# Patient Record
Sex: Male | Born: 1990
Health system: Southern US, Community
[De-identification: ages and names within clinical notes are randomized; demographics above are authoritative.]

## PROBLEM LIST (undated history)

## (undated) DIAGNOSIS — F329 Major depressive disorder, single episode, unspecified: Secondary | ICD-10-CM

## (undated) DIAGNOSIS — F32A Depression, unspecified: Secondary | ICD-10-CM

## (undated) DIAGNOSIS — F419 Anxiety disorder, unspecified: Secondary | ICD-10-CM

## (undated) HISTORY — DX: Depression, unspecified: F32.A

## (undated) HISTORY — DX: Anxiety disorder, unspecified: F41.9

## (undated) HISTORY — PX: KNEE ARTHROSCOPY W/ MENISCAL REPAIR: SHX1877

---
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
## 2013-03-25 ENCOUNTER — Encounter: Payer: Self-pay | Admitting: Family Medicine

## 2013-03-25 ENCOUNTER — Ambulatory Visit (INDEPENDENT_AMBULATORY_CARE_PROVIDER_SITE_OTHER): Payer: 59 | Admitting: Family Medicine

## 2013-03-25 VITALS — BP 132/86 | Ht 76.0 in | Wt 204.0 lb

## 2013-03-25 DIAGNOSIS — I889 Nonspecific lymphadenitis, unspecified: Secondary | ICD-10-CM

## 2013-03-25 NOTE — Progress Notes (Signed)
   Subjective:    Patient ID: John Holmes, male    DOB: [birth year], 23 y.o.   MRN: [id number]  HPI  Patient arrives with a marble sized  lump on left upper side of neck for several months. Area has gotten smaller in size and is not painful but has not went away. Patient notices it more when turning head.  Had a marble sized lump crop us, was a little bigger  Question swelling in this are  Non painful  A few sore pimples in the scalp   Review of Systems No headache no sore throat  Pain no recent congestion. No rash. No scalp irritation. ROS otherwise negative    Objective:   Physical Exam  Alert lungs clear. Heart regular in rhythm. HEENT scalp normal. TMs normal. Pharynx normal. Left posterior cervical chain normal lymph node palpated.      Assessment & Plan:  Impression normal lymph node architecture discussed perhaps slightly inflamed by recent scalp irritation plan definitely no need for consultation surgery etc. expect gradual resolution. WSL

## 2013-07-23 ENCOUNTER — Encounter: Payer: Self-pay | Admitting: Family Medicine

## 2013-07-23 ENCOUNTER — Ambulatory Visit (INDEPENDENT_AMBULATORY_CARE_PROVIDER_SITE_OTHER): Payer: BC Managed Care – PPO | Admitting: Family Medicine

## 2013-07-23 VITALS — BP 132/74 | Temp 98.7°F | Ht 76.0 in | Wt 205.0 lb

## 2013-07-23 DIAGNOSIS — R21 Rash and other nonspecific skin eruption: Secondary | ICD-10-CM

## 2013-07-23 NOTE — Progress Notes (Signed)
   Subjective:    Patient ID: John Holmes, male    DOB: 1990-07-09, 23 y.o.   MRN: [id number]  Rash The current episode started more than 1 month ago. Pain location: private area.    Patient is sexually active.  Developed a small spots that he thought was HPV several years ago. Now he feels may have spread.  No discharge no fever no chills   Review of Systems  Skin: Positive for rash.       Objective:   Physical Exam  Alert no apparent distress. Vitals stable. Lungs clear heart regular in rhythm. Distal penis shaft normal hypertrophic changes at juncture of glands and penile shaft. Normal. Several small tiny papules.      Assessment & Plan:  Impression 1 rash nonspecific. Possible early molluscum contagiosum discussed. No evidence of HPV or genital warts discussed. WSL

## 2013-07-23 NOTE — Patient Instructions (Signed)
Good news on the other concern, question molluscum contagiosum but doubt

## 2013-11-04 ENCOUNTER — Encounter: Payer: Self-pay | Admitting: Family Medicine

## 2013-11-04 ENCOUNTER — Ambulatory Visit (INDEPENDENT_AMBULATORY_CARE_PROVIDER_SITE_OTHER): Payer: BC Managed Care – PPO | Admitting: Family Medicine

## 2013-11-04 VITALS — BP 132/90 | Ht 76.0 in | Wt 203.0 lb

## 2013-11-04 DIAGNOSIS — R300 Dysuria: Secondary | ICD-10-CM

## 2013-11-04 DIAGNOSIS — Z113 Encounter for screening for infections with a predominantly sexual mode of transmission: Secondary | ICD-10-CM

## 2013-11-04 LAB — POCT URINALYSIS DIPSTICK
SPEC GRAV UA: 1.02
pH, UA: 6

## 2013-11-04 NOTE — Progress Notes (Signed)
   Subjective:    Patient ID: John Holmes, male    DOB: 08/21/1990, 23 y.o.   MRN: [id number]  Exposure to STD The patient's primary symptoms include genital itching and genital lesions. Primary symptoms comment: low back pain, rash . This is a new problem. The current episode started 1 to 4 weeks ago. The problem occurs daily. The problem has been gradually worsening. Associated symptoms include coughing, dysuria, a fever, flank pain and a rash. He is sexually active. It is possible that his partner has an STD.   Found out his ex-girlfriend cheated on him. He would like to be tested for STD's.   He stated he did have some back pain but that has gone away he also related some head congestion drainage and coughing that seems to have gotten better  Review of Systems  Constitutional: Positive for fever.  Respiratory: Positive for cough.   Genitourinary: Positive for dysuria and flank pain.  Skin: Positive for rash.       Objective:   Physical Exam Lungs are clear heart regular small amount healing bulbs noted on the left arm no active sign of scabies. Genital exam normal.       Assessment & Plan:  Best to use [phone] Possible STD exposure testing ordered   safety in relations discussed. The patient has severe viral illness may need further testing he will notify us if  fevers

## 2013-11-05 ENCOUNTER — Other Ambulatory Visit: Payer: Self-pay

## 2013-11-05 LAB — GC/CHLAMYDIA PROBE AMP
CT Probe RNA: POSITIVE — AB
GC PROBE AMP APTIMA: NEGATIVE

## 2013-11-05 LAB — HIV ANTIBODY (ROUTINE TESTING W REFLEX): HIV 1&2 Ab, 4th Generation: NONREACTIVE

## 2013-11-05 MED ORDER — DOXYCYCLINE HYCLATE 100 MG PO TABS: 100.0000 mg | ORAL_TABLET | Freq: Two times a day (BID) | ORAL | Status: DC

## 2013-11-06 LAB — VDRL: VDRL, Serum: NONREACTIVE

## 2013-11-30 ENCOUNTER — Telehealth: Payer: Self-pay | Admitting: Family Medicine

## 2013-11-30 NOTE — Telephone Encounter (Signed)
Patient was seen by Dr. Lorin PicketScott on 10/21/5 and diagnosed with STD.

## 2013-11-30 NOTE — Telephone Encounter (Signed)
Patient is requesting that Dr. Brett CanalesSteve call him back.  He said that the issue that he was having before has come back again and would like Rx called in.  Im assuming dysuria.    [facility]

## 2013-12-01 MED ORDER — DOXYCYCLINE HYCLATE 100 MG PO TABS: 100.0000 mg | ORAL_TABLET | Freq: Two times a day (BID) | ORAL | Status: DC

## 2013-12-01 NOTE — Telephone Encounter (Signed)
Please discuss with the patient what is going on. If he is having reoccurrence we can do one additional round of doxycycline 100 mg twice a day for 10 days with a snack in a tall glass of water. If he has further troubles he should come back in and be tested again as well as attentionally treated with a shot and oral antibiotics

## 2013-12-01 NOTE — Telephone Encounter (Signed)
Discussed with patient. Patient verbalized understanding. Rx sent electronically to pharmacy.

## 2014-03-26 ENCOUNTER — Encounter: Payer: Self-pay | Admitting: Family Medicine

## 2014-03-26 ENCOUNTER — Ambulatory Visit (INDEPENDENT_AMBULATORY_CARE_PROVIDER_SITE_OTHER): Payer: BLUE CROSS/BLUE SHIELD | Admitting: Family Medicine

## 2014-03-26 VITALS — BP 118/70 | Temp 98.3°F | Ht 76.0 in | Wt 202.4 lb

## 2014-03-26 DIAGNOSIS — M545 Low back pain, unspecified: Secondary | ICD-10-CM

## 2014-03-26 MED ORDER — ETODOLAC 400 MG PO TABS: 400.0000 mg | ORAL_TABLET | Freq: Two times a day (BID) | ORAL | Status: DC

## 2014-03-26 NOTE — Patient Instructions (Signed)

## 2014-03-26 NOTE — Progress Notes (Signed)
   Subjective:    Patient ID: John Holmes, male    DOB: Mar 12, 1990, 24 y.o.   MRN: [id number]  Back Pain This is a new problem. The current episode started more than 1 month ago. The problem occurs intermittently. The problem is unchanged. The pain is present in the lumbar spine. The quality of the pain is described as aching. The pain radiates to the right thigh and left thigh. The pain is at a severity of 8/10. The pain is moderate. The pain is the same all the time. Stiffness is present all day. Treatments tried: muscle rub  The treatment provided no relief.   Patient states that he wants to discuss his STD results with the doctor also.  Right lumbar pain over time  Very noticeable  Sits al days long   No true spasm pasin  Doing some muscle bk work  Pain comes and goes off and on   ibu two or three dys rn   Review of Systems  Musculoskeletal: Positive for back pain.   No headache no chest pain no abdominal pain no change in bowel habits    Objective:   Physical Exam  Alert vital stable no acute distress H&T normal lungs clear. Heart regular in rhythm negative straight leg raise low lumbar tenderness right lumbar region      Assessment & Plan:  Impression lumbar strain discussed subacute plan exercises discussed. Anti-inflammatory medicine prescribed. Local measures discussed WSL

## 2014-05-11 ENCOUNTER — Encounter: Payer: Self-pay | Admitting: Family Medicine

## 2014-05-11 ENCOUNTER — Ambulatory Visit (INDEPENDENT_AMBULATORY_CARE_PROVIDER_SITE_OTHER): Payer: BLUE CROSS/BLUE SHIELD | Admitting: Family Medicine

## 2014-05-11 VITALS — BP 126/82 | Ht 76.0 in | Wt 196.8 lb

## 2014-05-11 DIAGNOSIS — B349 Viral infection, unspecified: Secondary | ICD-10-CM

## 2014-05-11 NOTE — Progress Notes (Signed)
   Subjective:    Patient ID: Memory ArgueBrandon T Cravey, male    DOB: [birth year], 24 y.o.   MRN: [id number]  Headache  This is a new problem. The current episode started in the past 7 days. The problem occurs daily. He has tried NSAIDs for the symptoms.  Patient also reports of nasal congestion,, muscle weakness, and diarrhea.   Started last fri night  Felt horrible going to bed  Nauseated and felt bad  vom times one  Congested and stomach felt unsettled  Dim energy  Muscle weakness  Felt lassitude poss low gr temp  Appetite zero  Energy level zappped   Review of Systems  Neurological: Positive for headaches.   No current vomiting no rash    Objective:   Physical Exam  Alert mild malaise vitals stable hydration good. H&T moderate his congestion pharynx normal neck supple lungs clear. Heart regular in rhythm      Assessment & Plan:  Impression flu versus parainfluenza. At this time academic. No evidence of secondary brachial infection discussed plan symptom care discussed. Warning signs discussed. WSL

## 2014-06-21 ENCOUNTER — Ambulatory Visit (INDEPENDENT_AMBULATORY_CARE_PROVIDER_SITE_OTHER): Payer: BLUE CROSS/BLUE SHIELD | Admitting: Family Medicine

## 2014-06-21 ENCOUNTER — Encounter: Payer: Self-pay | Admitting: Family Medicine

## 2014-06-21 VITALS — BP 118/72 | Ht 76.0 in | Wt 214.0 lb

## 2014-06-21 DIAGNOSIS — R300 Dysuria: Secondary | ICD-10-CM | POA: Diagnosis not present

## 2014-06-21 DIAGNOSIS — Z113 Encounter for screening for infections with a predominantly sexual mode of transmission: Secondary | ICD-10-CM | POA: Diagnosis not present

## 2014-06-21 NOTE — Progress Notes (Signed)
   Subjective:    Patient ID: John Holmes, male    DOB: [birth year], 24 y.o.   MRN: [id number]  HPI  Patient arrives for a STD check.      Pt still a boit paranoid about the possibility of persistent std  afteer urinating has slight irritation.  No obvious discharge. Patient concerned about the appearance of the skin around the edge of the glands                              Review of Systems No fever no chills no abdominal pain no rash no blistering    Objective:   Physical Exam  Alert vitals stable lungs clear heart regular rhythm glands the penis within normal limits skin within normal limits. Normal papillary hypertrophy noted. GC swab obtain      Assessment & Plan:  Impression history of chlamydia and unprotected sex plan appropriate blood work. Swab sent. Safe sex discussed WSL

## 2014-06-22 LAB — RPR: RPR Ser Ql: NONREACTIVE

## 2014-06-22 LAB — HIV ANTIBODY (ROUTINE TESTING W REFLEX): HIV Screen 4th Generation wRfx: NONREACTIVE

## 2014-06-24 LAB — GC/CHLAMYDIA PROBE AMP
CHLAMYDIA, DNA PROBE: NEGATIVE
Neisseria gonorrhoeae by PCR: NEGATIVE

## 2014-09-01 ENCOUNTER — Encounter: Payer: Self-pay | Admitting: Family Medicine

## 2014-09-01 ENCOUNTER — Ambulatory Visit (INDEPENDENT_AMBULATORY_CARE_PROVIDER_SITE_OTHER): Payer: BLUE CROSS/BLUE SHIELD | Admitting: Family Medicine

## 2014-09-01 VITALS — BP 122/76 | Temp 99.2°F | Ht 76.0 in | Wt 210.0 lb

## 2014-09-01 DIAGNOSIS — B9689 Other specified bacterial agents as the cause of diseases classified elsewhere: Secondary | ICD-10-CM

## 2014-09-01 DIAGNOSIS — J019 Acute sinusitis, unspecified: Secondary | ICD-10-CM

## 2014-09-01 MED ORDER — AMOXICILLIN-POT CLAVULANATE 875-125 MG PO TABS: 1.0000 | ORAL_TABLET | Freq: Two times a day (BID) | ORAL | Status: DC

## 2014-09-01 NOTE — Patient Instructions (Signed)

## 2014-09-01 NOTE — Progress Notes (Signed)
   Subjective:    Patient ID: John Holmes, male    DOB: 06-18-1990, 24 y.o.   MRN: [id number]  Cough This is a new problem. The current episode started in the past 7 days. Associated symptoms include a fever, headaches, nasal congestion, rhinorrhea and a sore throat. Pertinent negatives include no chest pain, ear pain or wheezing. Treatments tried: nyquil.   started over the past several days head congestion drainage coughing then over the past 48 hours severe right maxillary and right frontal sinus pain some drainage. No fever no wheezing    Review of Systems  Constitutional: Positive for fever. Negative for activity change.  HENT: Positive for congestion, rhinorrhea and sore throat. Negative for ear pain.   Eyes: Negative for discharge.  Respiratory: Positive for cough. Negative for wheezing.   Cardiovascular: Negative for chest pain.  Neurological: Positive for headaches.       Objective:   Physical Exam  Constitutional: He appears well-developed.  HENT:  Head: Normocephalic.  Mouth/Throat: Oropharynx is clear and moist. No oropharyngeal exudate.  Neck: Normal range of motion.  Cardiovascular: Normal rate, regular rhythm and normal heart sounds.   No murmur heard. Pulmonary/Chest: Effort normal and breath sounds normal. He has no wheezes.  Lymphadenopathy:    He has no cervical adenopathy.  Neurological: He exhibits normal muscle tone.  Skin: Skin is warm and dry.  Nursing note and vitals reviewed.         Assessment & Plan:  Viral syndrome with secondary sinusitis antibiotics prescribed warning signs discussed follow-up if problems may use OTC decongestion's for a few days if worse follow-up

## 2015-01-07 ENCOUNTER — Encounter: Payer: Self-pay | Admitting: Family Medicine

## 2015-01-07 ENCOUNTER — Ambulatory Visit (INDEPENDENT_AMBULATORY_CARE_PROVIDER_SITE_OTHER): Payer: BLUE CROSS/BLUE SHIELD | Admitting: Family Medicine

## 2015-01-07 VITALS — BP 132/80 | Temp 98.8°F | Ht 74.5 in | Wt 216.0 lb

## 2015-01-07 DIAGNOSIS — Z113 Encounter for screening for infections with a predominantly sexual mode of transmission: Secondary | ICD-10-CM

## 2015-01-07 DIAGNOSIS — Z13220 Encounter for screening for lipoid disorders: Secondary | ICD-10-CM | POA: Diagnosis not present

## 2015-01-07 DIAGNOSIS — Z139 Encounter for screening, unspecified: Secondary | ICD-10-CM | POA: Diagnosis not present

## 2015-01-07 DIAGNOSIS — Z0000 Encounter for general adult medical examination without abnormal findings: Secondary | ICD-10-CM

## 2015-01-07 NOTE — Progress Notes (Signed)
   Subjective:    Patient ID: John Holmes, male    DOB: 1990/05/17, 24 y.o.   MRN: [id number]  HPI The patient comes in today for a wellness visit.  Still having slight dysuria, some discomfort, off an on  A review of their health history was completed.  A review of medications was also completed.  Any needed refills; not taking any meds  Eating habits: health conscious  Falls/  MVA accidents in past few months: none  Regular exercise: yes regular exercise at gym   Four to fve days per wk, now regulaly   stilll good with diet   Specialist pt sees on regular basis: none  Preventative health issues were discussed.   Additional concerns: std/hiv testing  patient is noted small bumps on his penis. No obvious pain. Also mild dysuria. Positive unprotected sex. History of chlamydia. Once to recheck   Review of Systems  Constitutional: Negative for fever, activity change and appetite change.  HENT: Negative for congestion and rhinorrhea.   Eyes: Negative for discharge.  Respiratory: Negative for cough and wheezing.   Cardiovascular: Negative for chest pain.  Gastrointestinal: Negative for vomiting, abdominal pain and blood in stool.  Genitourinary: Negative for frequency and difficulty urinating.  Musculoskeletal: Negative for neck pain.  Skin: Negative for rash.  Allergic/Immunologic: Negative for environmental allergies and food allergies.  Neurological: Negative for weakness and headaches.  Psychiatric/Behavioral: Negative for agitation.  All other systems reviewed and are negative.      Objective:   Physical Exam  Constitutional: He appears well-developed and well-nourished.  HENT:  Head: Normocephalic and atraumatic.  Right Ear: External ear normal.  Left Ear: External ear normal.  Nose: Nose normal.  Mouth/Throat: Oropharynx is clear and moist.  Eyes: EOM are normal. Pupils are equal, round, and reactive to light.  Neck: Normal range of motion. Neck  supple. No thyromegaly present.  Cardiovascular: Normal rate, regular rhythm and normal heart sounds.   No murmur heard. Pulmonary/Chest: Effort normal and breath sounds normal. No respiratory distress. He has no wheezes.  Abdominal: Soft. Bowel sounds are normal. He exhibits no distension and no mass. There is no tenderness.  Genitourinary: Penis normal.  Penis Chlamydia screen obtained. Shaft reveals several small tiny pearl-shaped bumps with central umbilication i.e. molluscum contagiosum  Musculoskeletal: Normal range of motion. He exhibits no edema.  Lymphadenopathy:    He has no cervical adenopathy.  Neurological: He is alert. He exhibits normal muscle tone.  Skin: Skin is warm and dry. No erythema.  Psychiatric: He has a normal mood and affect. His behavior is normal. Judgment normal.  Vitals reviewed.         Assessment & Plan:  IMP wellness exam, hx std. Probable moluscum contagiosum plan rec visit tpo derm, approp bw and testing , diet exerd disc

## 2015-01-12 LAB — GC/CHLAMYDIA PROBE AMP
CHLAMYDIA, DNA PROBE: NEGATIVE
Neisseria gonorrhoeae by PCR: NEGATIVE

## 2015-01-14 ENCOUNTER — Encounter: Payer: Self-pay | Admitting: Family Medicine

## 2015-01-14 ENCOUNTER — Ambulatory Visit (INDEPENDENT_AMBULATORY_CARE_PROVIDER_SITE_OTHER): Payer: BLUE CROSS/BLUE SHIELD | Admitting: Family Medicine

## 2015-01-14 VITALS — Temp 98.1°F | Ht 74.5 in | Wt 216.0 lb

## 2015-01-14 DIAGNOSIS — J019 Acute sinusitis, unspecified: Secondary | ICD-10-CM | POA: Diagnosis not present

## 2015-01-14 DIAGNOSIS — B9689 Other specified bacterial agents as the cause of diseases classified elsewhere: Secondary | ICD-10-CM

## 2015-01-14 MED ORDER — AZITHROMYCIN 250 MG PO TABS: ORAL_TABLET | ORAL | Status: DC

## 2015-01-14 NOTE — Progress Notes (Signed)
   Subjective:    Patient ID: John Holmes, male    DOB: [birth year], 24 y.o.   MRN: [id number]  Cough This is a new problem. The current episode started in the past 7 days. Associated symptoms include a fever, headaches, myalgias, nasal congestion, rhinorrhea and a sore throat. Pertinent negatives include no chest pain, ear pain or wheezing. Treatments tried: dayquil, advil, dayquil, nasal rinse.    patient started off severe body aches fever chills cough congestion and then progressed into sinus pressure pain discomfort   Review of Systems  Constitutional: Positive for fever. Negative for activity change.  HENT: Positive for congestion, rhinorrhea and sore throat. Negative for ear pain.   Eyes: Negative for discharge.  Respiratory: Positive for cough. Negative for wheezing.   Cardiovascular: Negative for chest pain.  Musculoskeletal: Positive for myalgias.  Neurological: Positive for headaches.       Objective:   Physical Exam  Constitutional: He appears well-developed.  HENT:  Head: Normocephalic.  Mouth/Throat: Oropharynx is clear and moist. No oropharyngeal exudate.  Neck: Normal range of motion.  Cardiovascular: Normal rate, regular rhythm and normal heart sounds.   No murmur heard. Pulmonary/Chest: Effort normal and breath sounds normal. He has no wheezes.  Lymphadenopathy:    He has no cervical adenopathy.  Neurological: He exhibits normal muscle tone.  Skin: Skin is warm and dry.  Nursing note and vitals reviewed.         Assessment & Plan:   viral syndrome Probable. Secondary sinusitis Antibiotics prescribed warning signs discussed.  Rest up if worse over the next week follow-up

## 2015-01-15 LAB — LIPID PANEL
CHOLESTEROL TOTAL: 129 mg/dL (ref 100–199)
Chol/HDL Ratio: 2.4 ratio units (ref 0.0–5.0)
HDL: 53 mg/dL (ref >39)
LDL CALC: 55 mg/dL (ref 0–99)
TRIGLYCERIDES: 104 mg/dL (ref 0–149)
VLDL Cholesterol Cal: 21 mg/dL (ref 5–40)

## 2015-01-15 LAB — GLUCOSE, RANDOM: Glucose: 90 mg/dL (ref 65–99)

## 2015-01-15 LAB — HIV ANTIBODY (ROUTINE TESTING W REFLEX): HIV SCREEN 4TH GENERATION: NONREACTIVE

## 2015-01-18 ENCOUNTER — Encounter: Payer: Self-pay | Admitting: Family Medicine

## 2015-06-14 ENCOUNTER — Encounter: Payer: Self-pay | Admitting: Family Medicine

## 2015-06-14 ENCOUNTER — Ambulatory Visit (INDEPENDENT_AMBULATORY_CARE_PROVIDER_SITE_OTHER): Payer: BLUE CROSS/BLUE SHIELD | Admitting: Family Medicine

## 2015-06-14 VITALS — BP 132/88 | Temp 98.7°F | Ht 76.0 in | Wt 210.0 lb

## 2015-06-14 DIAGNOSIS — J019 Acute sinusitis, unspecified: Secondary | ICD-10-CM | POA: Diagnosis not present

## 2015-06-14 DIAGNOSIS — F411 Generalized anxiety disorder: Secondary | ICD-10-CM | POA: Diagnosis not present

## 2015-06-14 DIAGNOSIS — R5383 Other fatigue: Secondary | ICD-10-CM

## 2015-06-14 DIAGNOSIS — Z113 Encounter for screening for infections with a predominantly sexual mode of transmission: Secondary | ICD-10-CM | POA: Diagnosis not present

## 2015-06-14 DIAGNOSIS — B9689 Other specified bacterial agents as the cause of diseases classified elsewhere: Secondary | ICD-10-CM

## 2015-06-14 MED ORDER — AZITHROMYCIN 250 MG PO TABS: ORAL_TABLET | ORAL | Status: DC

## 2015-06-14 NOTE — Progress Notes (Signed)
   Subjective:    Patient ID: John Holmes, male    DOB: 1990/01/17, 25 y.o.   MRN: [id number]  Sinusitis This is a new problem. Episode onset: about two weeks ago. Associated symptoms include congestion, coughing, headaches and a sore throat. (Fever, abd pain ) Treatments tried: otc meds, zyrtec, tylenol sinus, nasal rinse. The treatment provided moderate relief.   Confusion and dizziness off and on for a few months.   BBT not sleeping the best  Work overall good but in a high stress environment, prodctn based, A bit anxious at times  Not active in the past ten days  Rash on chest. Was on chest for one night and went away the next day.    Had a random rash all over body a few months ago that cleared up.   Wants std screening., Patient very anxious about the potential for STD   Anxiety. , Substantial, claims no suicidal thoughts. It has been very anxious. Starting to get the best of them. Having periods of confusion fuzzy thinking. Often feels dizzy at times. At times feeling somewhat overwhelmed  Cough and cong and dranage and overall  Lethargy and fatige and tiredness over all  Sleeping not the best   Review of Systems  HENT: Positive for congestion and sore throat.   Respiratory: Positive for cough.   Neurological: Positive for headaches.       Objective:   Physical Exam  Alert vitals stable. H&T moderate nasal congestion pharynx slight erythema neck supple lungs clear heart regular rate and rhythm penis within normal limits GC chlamydia screen obtained      Assessment & Plan:  Impression 1 rhinosinusitis element of bronchitis #2 generalized anxiety potentially situational. Patient is stressed about his new job discussed 3 STD concerns appropriate screening done today #4 rash concerned about it but it's absent at this time #5 fatigue inexplicable to the patient he's worried about it plan antibiotics prescribed. Appropriate blood work. 25 minutes spent most in  discussion. Alternative return next week for further discussion of patient's chronic problems in light of anything we may have time on bloodwork and discussion about anxiety meds side effects benefits etc.

## 2015-06-16 DIAGNOSIS — R5383 Other fatigue: Secondary | ICD-10-CM | POA: Diagnosis not present

## 2015-06-16 DIAGNOSIS — F411 Generalized anxiety disorder: Secondary | ICD-10-CM | POA: Insufficient documentation

## 2015-06-16 DIAGNOSIS — Z113 Encounter for screening for infections with a predominantly sexual mode of transmission: Secondary | ICD-10-CM | POA: Diagnosis not present

## 2015-06-16 LAB — GC/CHLAMYDIA PROBE AMP
Chlamydia trachomatis, NAA: NEGATIVE
NEISSERIA GONORRHOEAE BY PCR: NEGATIVE

## 2015-06-17 LAB — HIV ANTIBODY (ROUTINE TESTING W REFLEX): HIV Screen 4th Generation wRfx: NONREACTIVE

## 2015-06-17 LAB — CBC WITH DIFFERENTIAL/PLATELET
Basophils Absolute: 0.1 10*3/uL (ref 0.0–0.2)
Basos: 1 %
EOS (ABSOLUTE): 0.1 10*3/uL (ref 0.0–0.4)
EOS: 1 %
HEMATOCRIT: 45.1 % (ref 37.5–51.0)
HEMOGLOBIN: 15.2 g/dL (ref 12.6–17.7)
IMMATURE GRANULOCYTES: 0 %
Immature Grans (Abs): 0 10*3/uL (ref 0.0–0.1)
Lymphocytes Absolute: 2.1 10*3/uL (ref 0.7–3.1)
Lymphs: 34 %
MCH: 28.8 pg (ref 26.6–33.0)
MCHC: 33.7 g/dL (ref 31.5–35.7)
MCV: 86 fL (ref 79–97)
MONOCYTES: 7 %
Monocytes Absolute: 0.4 10*3/uL (ref 0.1–0.9)
NEUTROS PCT: 57 %
Neutrophils Absolute: 3.6 10*3/uL (ref 1.4–7.0)
Platelets: 163 10*3/uL (ref 150–379)
RBC: 5.27 x10E6/uL (ref 4.14–5.80)
RDW: 14.1 % (ref 12.3–15.4)
WBC: 6.3 10*3/uL (ref 3.4–10.8)

## 2015-06-17 LAB — BASIC METABOLIC PANEL
BUN/Creatinine Ratio: 15 (ref 9–20)
BUN: 16 mg/dL (ref 6–20)
CALCIUM: 9.5 mg/dL (ref 8.7–10.2)
CO2: 25 mmol/L (ref 18–29)
CREATININE: 1.06 mg/dL (ref 0.76–1.27)
Chloride: 97 mmol/L (ref 96–106)
GFR, EST AFRICAN AMERICAN: 114 mL/min/{1.73_m2} (ref >59)
GFR, EST NON AFRICAN AMERICAN: 98 mL/min/{1.73_m2} (ref >59)
Glucose: 62 mg/dL — ABNORMAL LOW (ref 65–99)
POTASSIUM: 4.4 mmol/L (ref 3.5–5.2)
Sodium: 138 mmol/L (ref 134–144)

## 2015-06-17 LAB — HEPATIC FUNCTION PANEL
ALBUMIN: 4.6 g/dL (ref 3.5–5.5)
ALK PHOS: 50 IU/L (ref 39–117)
ALT: 24 IU/L (ref 0–44)
AST: 21 IU/L (ref 0–40)
BILIRUBIN TOTAL: 0.8 mg/dL (ref 0.0–1.2)
Bilirubin, Direct: 0.19 mg/dL (ref 0.00–0.40)
TOTAL PROTEIN: 7.1 g/dL (ref 6.0–8.5)

## 2015-06-17 LAB — TSH: TSH: 1.27 u[IU]/mL (ref 0.450–4.500)

## 2015-06-17 LAB — RPR: RPR Ser Ql: NONREACTIVE

## 2015-06-22 ENCOUNTER — Encounter: Payer: Self-pay | Admitting: Family Medicine

## 2015-06-22 ENCOUNTER — Ambulatory Visit (INDEPENDENT_AMBULATORY_CARE_PROVIDER_SITE_OTHER): Payer: BLUE CROSS/BLUE SHIELD | Admitting: Family Medicine

## 2015-06-22 VITALS — BP 138/88 | Temp 98.5°F | Ht 76.0 in | Wt 210.0 lb

## 2015-06-22 DIAGNOSIS — F4010 Social phobia, unspecified: Secondary | ICD-10-CM

## 2015-06-22 MED ORDER — CLONAZEPAM 0.5 MG PO TABS: ORAL_TABLET | ORAL | Status: DC

## 2015-06-22 NOTE — Progress Notes (Signed)
Subjective:    Patient ID: John Holmes, male    DOB: 01-17-1990, 25 y.o.   MRN: [id number]  HPIFollow up on anxiety.  Patient arrives office with multiple and complicated concerns  Long discussion held about stress sores in recent years. Broke up with a girlfriend that he really cared for at the end of college. She ran around on them. Facing a lot of challenges at work working with some folks that he does not trust ethically speaking. Also a lot of demands which way to anxiety and workplace anxiety and social phobia.  Also feeling down about unprotected sex and no steady relationship at this time. Also very worried about potential for STD  Also concerned about a father who use quite sick with chronic lung disease. Worried about the implications of this. Has had a bit of a mixed relationship with his father down through the years  Pt wants bloodwork results.  Results for orders placed or performed in visit on 06/14/15  GC/Chlamydia Probe Amp  Result Value Ref Range   Chlamydia trachomatis, NAA Negative Negative   Neisseria gonorrhoeae by PCR Negative Negative  TSH  Result Value Ref Range   TSH 1.270 0.450 - 4.500 uIU/mL  RPR  Result Value Ref Range   RPR Ser Ql Non Reactive Non Reactive  CBC with Differential/Platelet  Result Value Ref Range   WBC 6.3 3.4 - 10.8 x10E3/uL   RBC 5.27 4.14 - 5.80 x10E6/uL   Hemoglobin 15.2 12.6 - 17.7 g/dL   Hematocrit 40.9 81.1 - 51.0 %   MCV 86 79 - 97 fL   MCH 28.8 26.6 - 33.0 pg   MCHC 33.7 31.5 - 35.7 g/dL   RDW 91.4 78.2 - 95.6 %   Platelets 163 150 - 379 x10E3/uL   Neutrophils 57 %   Lymphs 34 %   Monocytes 7 %   Eos 1 %   Basos 1 %   Neutrophils Absolute 3.6 1.4 - 7.0 x10E3/uL   Lymphocytes Absolute 2.1 0.7 - 3.1 x10E3/uL   Monocytes Absolute 0.4 0.1 - 0.9 x10E3/uL   EOS (ABSOLUTE) 0.1 0.0 - 0.4 x10E3/uL   Basophils Absolute 0.1 0.0 - 0.2 x10E3/uL   Immature Granulocytes 0 %   Immature Grans (Abs) 0.0 0.0 - 0.1 x10E3/uL    Hepatic function panel  Result Value Ref Range   Total Protein 7.1 6.0 - 8.5 g/dL   Albumin 4.6 3.5 - 5.5 g/dL   Bilirubin Total 0.8 0.0 - 1.2 mg/dL   Bilirubin, Direct 2.13 0.00 - 0.40 mg/dL   Alkaline Phosphatase 50 39 - 117 IU/L   AST 21 0 - 40 IU/L   ALT 24 0 - 44 IU/L  Basic metabolic panel  Result Value Ref Range   Glucose 62 (L) 65 - 99 mg/dL   BUN 16 6 - 20 mg/dL   Creatinine, Ser 0.86 0.76 - 1.27 mg/dL   GFR calc non Af Amer 98 >59 mL/min/1.73   GFR calc Af Amer 114 >59 mL/min/1.73   BUN/Creatinine Ratio 15 9 - 20   Sodium 138 134 - 144 mmol/L   Potassium 4.4 3.5 - 5.2 mmol/L   Chloride 97 96 - 106 mmol/L   CO2 25 18 - 29 mmol/L   Calcium 9.5 8.7 - 10.2 mg/dL  HIV antibody  Result Value Ref Range   HIV Screen 4th Generation wRfx Non Reactive Non Reactive      Review of Systems No headache, no major weight loss or  weight gain, no chest pain no back pain abdominal pain no change in bowel habits complete ROS otherwise negative     Objective:   Physical Exam Alert vitals stable. HEENT normal lungs clear. Heart regular in rhythm. Abdomen benign       Assessment & Plan:  Impression 1 depression discussed mild to moderate nature. Patient not interested meds at this time #2 generalized anxiety disorder with also social phobia discussed patient would like a trial medicines for this #3 STD concerns concerns outweighed with excellent blood work plan 25 minutes spent most in discussion. Exercise encourage. When necessary Klonopin side effects benefits discussed WSL

## 2015-06-22 NOTE — Patient Instructions (Signed)
Results for orders placed or performed in visit on 06/14/15  GC/Chlamydia Probe Amp  Result Value Ref Range   Chlamydia trachomatis, NAA Negative Negative   Neisseria gonorrhoeae by PCR Negative Negative  TSH  Result Value Ref Range   TSH 1.270 0.450 - 4.500 uIU/mL  RPR  Result Value Ref Range   RPR Ser Ql Non Reactive Non Reactive  CBC with Differential/Platelet  Result Value Ref Range   WBC 6.3 3.4 - 10.8 x10E3/uL   RBC 5.27 4.14 - 5.80 x10E6/uL   Hemoglobin 15.2 12.6 - 17.7 g/dL   Hematocrit 59.545.1 63.837.5 - 51.0 %   MCV 86 79 - 97 fL   MCH 28.8 26.6 - 33.0 pg   MCHC 33.7 31.5 - 35.7 g/dL   RDW 75.614.1 43.312.3 - 29.515.4 %   Platelets 163 150 - 379 x10E3/uL   Neutrophils 57 %   Lymphs 34 %   Monocytes 7 %   Eos 1 %   Basos 1 %   Neutrophils Absolute 3.6 1.4 - 7.0 x10E3/uL   Lymphocytes Absolute 2.1 0.7 - 3.1 x10E3/uL   Monocytes Absolute 0.4 0.1 - 0.9 x10E3/uL   EOS (ABSOLUTE) 0.1 0.0 - 0.4 x10E3/uL   Basophils Absolute 0.1 0.0 - 0.2 x10E3/uL   Immature Granulocytes 0 %   Immature Grans (Abs) 0.0 0.0 - 0.1 x10E3/uL  Hepatic function panel  Result Value Ref Range   Total Protein 7.1 6.0 - 8.5 g/dL   Albumin 4.6 3.5 - 5.5 g/dL   Bilirubin Total 0.8 0.0 - 1.2 mg/dL   Bilirubin, Direct 1.880.19 0.00 - 0.40 mg/dL   Alkaline Phosphatase 50 39 - 117 IU/L   AST 21 0 - 40 IU/L   ALT 24 0 - 44 IU/L  Basic metabolic panel  Result Value Ref Range   Glucose 62 (L) 65 - 99 mg/dL   BUN 16 6 - 20 mg/dL   Creatinine, Ser 4.161.06 0.76 - 1.27 mg/dL   GFR calc non Af Amer 98 >59 mL/min/1.73   GFR calc Af Amer 114 >59 mL/min/1.73   BUN/Creatinine Ratio 15 9 - 20   Sodium 138 134 - 144 mmol/L   Potassium 4.4 3.5 - 5.2 mmol/L   Chloride 97 96 - 106 mmol/L   CO2 25 18 - 29 mmol/L   Calcium 9.5 8.7 - 10.2 mg/dL  HIV antibody  Result Value Ref Range   HIV Screen 4th Generation wRfx Non Reactive Non Reactive

## 2015-06-23 DIAGNOSIS — F4010 Social phobia, unspecified: Secondary | ICD-10-CM | POA: Insufficient documentation

## 2015-07-28 DIAGNOSIS — Z0000 Encounter for general adult medical examination without abnormal findings: Secondary | ICD-10-CM | POA: Diagnosis not present

## 2015-12-05 DIAGNOSIS — R350 Frequency of micturition: Secondary | ICD-10-CM | POA: Diagnosis not present

## 2016-09-04 ENCOUNTER — Encounter: Payer: Self-pay | Admitting: Family Medicine

## 2016-09-04 ENCOUNTER — Ambulatory Visit (INDEPENDENT_AMBULATORY_CARE_PROVIDER_SITE_OTHER): Payer: BLUE CROSS/BLUE SHIELD | Admitting: Family Medicine

## 2016-09-04 VITALS — BP 118/76 | Temp 98.2°F | Ht 76.0 in | Wt 213.4 lb

## 2016-09-04 DIAGNOSIS — J019 Acute sinusitis, unspecified: Secondary | ICD-10-CM | POA: Diagnosis not present

## 2016-09-04 DIAGNOSIS — K5901 Slow transit constipation: Secondary | ICD-10-CM

## 2016-09-04 DIAGNOSIS — B9689 Other specified bacterial agents as the cause of diseases classified elsewhere: Secondary | ICD-10-CM | POA: Diagnosis not present

## 2016-09-04 MED ORDER — AMOXICILLIN-POT CLAVULANATE 875-125 MG PO TABS: 1.0000 | ORAL_TABLET | Freq: Two times a day (BID) | ORAL | 0 refills | Status: AC

## 2016-09-04 NOTE — Patient Instructions (Signed)
Consider miralax one scoop dailyas needed for constipationHigh-Fiber Diet Fiber, also called dietary fiber, is a type of carbohydrate found in fruits, vegetables, whole grains, and beans. A high-fiber diet can have many health benefits. Your health care provider may recommend a high-fiber diet to help:  Prevent constipation. Fiber can make your bowel movements more regular.  Lower your cholesterol.  Relieve hemorrhoids, uncomplicated diverticulosis, or irritable bowel syndrome.  Prevent overeating as part of a weight-loss plan.  Prevent heart disease, type 2 diabetes, and certain cancers.  What is my plan? The recommended daily intake of fiber includes:  38 grams for men under age 1.  30 grams for men over age 90.  25 grams for women under age 62.  21 grams for women over age 70.  You can get the recommended daily intake of dietary fiber by eating a variety of fruits, vegetables, grains, and beans. Your health care provider may also recommend a fiber supplement if it is not possible to get enough fiber through your diet. What do I need to know about a high-fiber diet?  Fiber supplements have not been widely studied for their effectiveness, so it is better to get fiber through food sources.  Always check the fiber content on thenutrition facts label of any prepackaged food. Look for foods that contain at least 5 grams of fiber per serving.  Ask your dietitian if you have questions about specific foods that are related to your condition, especially if those foods are not listed in the following section.  Increase your daily fiber consumption gradually. Increasing your intake of dietary fiber too quickly may cause bloating, cramping, or gas.  Drink plenty of water. Water helps you to digest fiber. What foods can I eat? Grains Whole-grain breads. Multigrain cereal. Oats and oatmeal. Brown rice. Barley. Bulgur wheat. Millet. Bran muffins. Popcorn. Rye wafer  crackers. Vegetables Sweet potatoes. Spinach. Kale. Artichokes. Cabbage. Broccoli. Green peas. Carrots. Squash. Fruits Berries. Pears. Apples. Oranges. Avocados. Prunes and raisins. Dried figs. Meats and Other Protein Sources Navy, kidney, pinto, and soy beans. Split peas. Lentils. Nuts and seeds. Dairy Fiber-fortified yogurt. Beverages Fiber-fortified soy milk. Fiber-fortified orange juice. Other Fiber bars. The items listed above may not be a complete list of recommended foods or beverages. Contact your dietitian for more options. What foods are not recommended? Grains White bread. Pasta made with refined flour. White rice. Vegetables Fried potatoes. Canned vegetables. Well-cooked vegetables. Fruits Fruit juice. Cooked, strained fruit. Meats and Other Protein Sources Fatty cuts of meat. Fried Environmental education officer or fried fish. Dairy Milk. Yogurt. Cream cheese. Sour cream. Beverages Soft drinks. Other Cakes and pastries. Butter and oils. The items listed above may not be a complete list of foods and beverages to avoid. Contact your dietitian for more information. What are some tips for including high-fiber foods in my diet?  Eat a wide variety of high-fiber foods.  Make sure that half of all grains consumed each day are whole grains.  Replace breads and cereals made from refined flour or white flour with whole-grain breads and cereals.  Replace white rice with brown rice, bulgur wheat, or millet.  Start the day with a breakfast that is high in fiber, such as a cereal that contains at least 5 grams of fiber per serving.  Use beans in place of meat in soups, salads, or pasta.  Eat high-fiber snacks, such as berries, raw vegetables, nuts, or popcorn. This information is not intended to replace advice given to you by your health  care provider. Make sure you discuss any questions you have with your health care provider. Document Released: 01/01/2005 Document Revised: 06/09/2015 Document  Reviewed: 06/16/2013 Elsevier Interactive Patient Education  2017 [employer].

## 2016-09-04 NOTE — Progress Notes (Signed)
   Subjective:    Patient ID: John Holmes, male    DOB: 1990-12-03, 26 y.o.   MRN: [id number]  Sinus Problem  This is a new problem. The current episode started 1 to 4 weeks ago. Associated symptoms include congestion, coughing and headaches. Treatments tried: zyrtec.    Cong and dranage two weks aog, slight cong as week went on, got a bit worse, comes and goes, fri night got worse and worse, using saline rinses, now headache frontal sharp and aching, owwrse with position  Bowels off and on, some hard some soft, comes out as pellets,   Diet not so good  Stools hard, livig on own, more fast food, using lean cuisine etc, chicken patties etc,, uses fair diet in th eve   Review of Systems  HENT: Positive for congestion.   Respiratory: Positive for cough.   Neurological: Positive for headaches.       Objective:   Physical Exam  Alert, mild malaise. Hydration good Vitals stable. frontal/ maxillary tenderness evident positive nasal congestion. pharynx normal neck supple  lungs clear/no crackles or wheezes. heart regular in rhythm  Abdominal exam benign     Assessment & Plan:  Impression 1 rhinosinusitis #2 constipation intermittently recent months due to suboptimal in diet. Interventional measures discussed. X when necessary. Increase fiber. Antibiotics prescribed and symptom care discussed

## 2017-01-28 ENCOUNTER — Encounter: Payer: BLUE CROSS/BLUE SHIELD | Admitting: Family Medicine

## 2017-02-05 ENCOUNTER — Ambulatory Visit (INDEPENDENT_AMBULATORY_CARE_PROVIDER_SITE_OTHER): Payer: BLUE CROSS/BLUE SHIELD | Admitting: Family Medicine

## 2017-02-05 ENCOUNTER — Encounter: Payer: Self-pay | Admitting: Family Medicine

## 2017-02-05 VITALS — BP 132/80 | Ht 76.0 in | Wt 212.0 lb

## 2017-02-05 DIAGNOSIS — Z0000 Encounter for general adult medical examination without abnormal findings: Secondary | ICD-10-CM

## 2017-02-05 DIAGNOSIS — Z113 Encounter for screening for infections with a predominantly sexual mode of transmission: Secondary | ICD-10-CM

## 2017-02-05 DIAGNOSIS — R5383 Other fatigue: Secondary | ICD-10-CM | POA: Diagnosis not present

## 2017-02-05 DIAGNOSIS — Z13220 Encounter for screening for lipoid disorders: Secondary | ICD-10-CM | POA: Diagnosis not present

## 2017-02-05 NOTE — Progress Notes (Signed)
   Subjective:    Patient ID: John Holmes, male    DOB: [birth year], 27 y.o.   MRN: [id number]  HPI The patient comes in today for a wellness visit.    A review of their health history was completed.  A review of medications was also completed.  Any needed refills; not taking any meds  Eating habits: health conscious  Falls/  MVA accidents in past few months: none  Regular exercise: 6 times a week.   Specialist pt sees on regular basis: none  Preventative health issues were discussed.   Additional concerns:   concerns about anxiety.   wants std testing. Hiv, gc/chyam, herpes.   Wants to discuss left side of his body is longer than the right side of the body.    Overall exrcising very reg  Also working on a daily vit and probiotic nand dha  Noting substantial fatigue lately  Pt worried  About possibility of std,  Also worrie about one side of the body gtowing faster that  styku exam looking at body fat and body composition whether you are athletic etc. Or not. Pt taking this and slf diagnosing med prob of one side more growth than the other    ++         Review of Systems  Constitutional: Negative for activity change, appetite change and fever.  HENT: Negative for congestion and rhinorrhea.   Eyes: Negative for discharge.  Respiratory: Negative for cough and wheezing.   Cardiovascular: Negative for chest pain.  Gastrointestinal: Negative for abdominal pain, blood in stool and vomiting.  Genitourinary: Negative for difficulty urinating and frequency.  Musculoskeletal: Negative for neck pain.  Skin: Negative for rash.  Allergic/Immunologic: Negative for environmental allergies and food allergies.  Neurological: Negative for weakness and headaches.  Psychiatric/Behavioral: Negative for agitation.  All other systems reviewed and are negative.      Objective:   Physical Exam  Constitutional: He appears well-developed and well-nourished.  HENT:   Head: Normocephalic and atraumatic.  Right Ear: External ear normal.  Left Ear: External ear normal.  Nose: Nose normal.  Mouth/Throat: Oropharynx is clear and moist.  Eyes: Right eye exhibits no discharge. Left eye exhibits no discharge. No scleral icterus.  Neck: Normal range of motion. Neck supple. No thyromegaly present.  Cardiovascular: Normal rate, regular rhythm and normal heart sounds.  No murmur heard. Pulmonary/Chest: Effort normal and breath sounds normal. No respiratory distress. He has no wheezes.  Abdominal: Soft. Bowel sounds are normal. He exhibits no distension and no mass. There is no tenderness.  Genitourinary: Penis normal.  Musculoskeletal: Normal range of motion. He exhibits no edema.  Lymphadenopathy:    He has no cervical adenopathy.  Neurological: He is alert. He exhibits normal muscle tone. Coordination normal.  Skin: Skin is warm and dry. No erythema.  Psychiatric: He has a normal mood and affect. His behavior is normal. Judgment normal.  Vitals reviewed.         Assessment & Plan:  Follow-up impression wellness exam.  Discussed.  Diet discussed.  Exercise discussed.  Generalized anxiety disorder appears to be worsening.  Long discussion held.  Patient to return for further discussion regarding interventions  3.  Fatigue etiology unclear will add some blood work  4.  STD concerns.  Will check test per request follow-up as scheduled

## 2017-02-06 DIAGNOSIS — Z113 Encounter for screening for infections with a predominantly sexual mode of transmission: Secondary | ICD-10-CM | POA: Diagnosis not present

## 2017-02-06 DIAGNOSIS — R5383 Other fatigue: Secondary | ICD-10-CM | POA: Diagnosis not present

## 2017-02-06 DIAGNOSIS — Z13220 Encounter for screening for lipoid disorders: Secondary | ICD-10-CM | POA: Diagnosis not present

## 2017-02-06 DIAGNOSIS — Z0000 Encounter for general adult medical examination without abnormal findings: Secondary | ICD-10-CM | POA: Diagnosis not present

## 2017-02-08 LAB — GC/CHLAMYDIA PROBE AMP
Chlamydia trachomatis, NAA: NEGATIVE
Neisseria gonorrhoeae by PCR: NEGATIVE

## 2017-02-12 LAB — TSH: TSH: 1.26 u[IU]/mL (ref 0.450–4.500)

## 2017-02-12 LAB — BASIC METABOLIC PANEL
BUN/Creatinine Ratio: 11 (ref 9–20)
BUN: 14 mg/dL (ref 6–20)
CO2: 21 mmol/L (ref 20–29)
Calcium: 10 mg/dL (ref 8.7–10.2)
Chloride: 98 mmol/L (ref 96–106)
Creatinine, Ser: 1.22 mg/dL (ref 0.76–1.27)
GFR calc Af Amer: 95 mL/min/{1.73_m2} (ref >59)
GFR, EST NON AFRICAN AMERICAN: 82 mL/min/{1.73_m2} (ref >59)
GLUCOSE: 93 mg/dL (ref 65–99)
Potassium: 4.7 mmol/L (ref 3.5–5.2)
Sodium: 139 mmol/L (ref 134–144)

## 2017-02-12 LAB — HEPATIC FUNCTION PANEL
ALBUMIN: 4.8 g/dL (ref 3.5–5.5)
ALK PHOS: 49 IU/L (ref 39–117)
ALT: 45 IU/L — ABNORMAL HIGH (ref 0–44)
AST: 57 IU/L — AB (ref 0–40)
Bilirubin Total: 1.6 mg/dL — ABNORMAL HIGH (ref 0.0–1.2)
Bilirubin, Direct: 0.3 mg/dL (ref 0.00–0.40)
Total Protein: 7.4 g/dL (ref 6.0–8.5)

## 2017-02-12 LAB — CBC WITH DIFFERENTIAL/PLATELET
Basophils Absolute: 0 10*3/uL (ref 0.0–0.2)
Basos: 1 %
EOS (ABSOLUTE): 0.1 10*3/uL (ref 0.0–0.4)
EOS: 1 %
HEMATOCRIT: 44.7 % (ref 37.5–51.0)
Hemoglobin: 15.2 g/dL (ref 13.0–17.7)
Immature Grans (Abs): 0 10*3/uL (ref 0.0–0.1)
Immature Granulocytes: 0 %
Lymphocytes Absolute: 1.7 10*3/uL (ref 0.7–3.1)
Lymphs: 35 %
MCH: 29.5 pg (ref 26.6–33.0)
MCHC: 34 g/dL (ref 31.5–35.7)
MCV: 87 fL (ref 79–97)
Monocytes Absolute: 0.5 10*3/uL (ref 0.1–0.9)
Monocytes: 10 %
Neutrophils Absolute: 2.6 10*3/uL (ref 1.4–7.0)
Neutrophils: 53 %
Platelets: 204 10*3/uL (ref 150–379)
RBC: 5.16 x10E6/uL (ref 4.14–5.80)
RDW: 14.6 % (ref 12.3–15.4)
WBC: 4.9 10*3/uL (ref 3.4–10.8)

## 2017-02-12 LAB — LIPID PANEL
Chol/HDL Ratio: 2 ratio (ref 0.0–5.0)
Cholesterol, Total: 121 mg/dL (ref 100–199)
HDL: 60 mg/dL (ref >39)
LDL Calculated: 50 mg/dL (ref 0–99)
TRIGLYCERIDES: 54 mg/dL (ref 0–149)
VLDL CHOLESTEROL CAL: 11 mg/dL (ref 5–40)

## 2017-02-12 LAB — VDRL: NON-TREPONEMAL SCREENING VDRL: NORMAL

## 2017-02-12 LAB — HIV ANTIBODY (ROUTINE TESTING W REFLEX): HIV Screen 4th Generation wRfx: NONREACTIVE

## 2017-02-19 ENCOUNTER — Encounter: Payer: Self-pay | Admitting: Family Medicine

## 2017-02-19 ENCOUNTER — Ambulatory Visit (INDEPENDENT_AMBULATORY_CARE_PROVIDER_SITE_OTHER): Payer: BLUE CROSS/BLUE SHIELD | Admitting: Family Medicine

## 2017-02-19 VITALS — BP 118/86 | Ht 76.0 in | Wt 214.8 lb

## 2017-02-19 DIAGNOSIS — R748 Abnormal levels of other serum enzymes: Secondary | ICD-10-CM

## 2017-02-19 DIAGNOSIS — F411 Generalized anxiety disorder: Secondary | ICD-10-CM | POA: Diagnosis not present

## 2017-02-19 MED ORDER — BUPROPION HCL ER (SR) 150 MG PO TB12: 150.0000 mg | ORAL_TABLET | Freq: Two times a day (BID) | ORAL | 5 refills | Status: DC

## 2017-02-19 NOTE — Progress Notes (Signed)
Subjective:    Patient ID: John Holmes, male    DOB: [birth year], 27 y.o.   MRN: [id number] Patient arrives with numerous concerns HPI Pt is here today for 2 week follow up on anxiety and lab results from last visit. Results for orders placed or performed in visit on 02/05/17  GC/Chlamydia Probe Amp([facility])  Result Value Ref Range   Chlamydia trachomatis, NAA Negative Negative   Neisseria gonorrhoeae by PCR Negative Negative  Lipid panel  Result Value Ref Range   Cholesterol, Total 121 100 - 199 mg/dL   Triglycerides 54 0 - 149 mg/dL   HDL 60 >40 mg/dL   VLDL Cholesterol Cal 11 5 - 40 mg/dL   LDL Calculated 50 0 - 99 mg/dL   Chol/HDL Ratio 2.0 0.0 - 5.0 ratio  Hepatic function panel  Result Value Ref Range   Total Protein 7.4 6.0 - 8.5 g/dL   Albumin 4.8 3.5 - 5.5 g/dL   Bilirubin Total 1.6 (H) 0.0 - 1.2 mg/dL   Bilirubin, Direct 9.81 0.00 - 0.40 mg/dL   Alkaline Phosphatase 49 39 - 117 IU/L   AST 57 (H) 0 - 40 IU/L   ALT 45 (H) 0 - 44 IU/L  Basic metabolic panel  Result Value Ref Range   Glucose 93 65 - 99 mg/dL   BUN 14 6 - 20 mg/dL   Creatinine, Ser 1.91 0.76 - 1.27 mg/dL   GFR calc non Af Amer 82 >59 mL/min/1.73   GFR calc Af Amer 95 >59 mL/min/1.73   BUN/Creatinine Ratio 11 9 - 20   Sodium 139 134 - 144 mmol/L   Potassium 4.7 3.5 - 5.2 mmol/L   Chloride 98 96 - 106 mmol/L   CO2 21 20 - 29 mmol/L   Calcium 10.0 8.7 - 10.2 mg/dL  TSH  Result Value Ref Range   TSH 1.260 0.450 - 4.500 uIU/mL  CBC with Differential/Platelet  Result Value Ref Range   WBC 4.9 3.4 - 10.8 x10E3/uL   RBC 5.16 4.14 - 5.80 x10E6/uL   Hemoglobin 15.2 13.0 - 17.7 g/dL   Hematocrit 47.8 29.5 - 51.0 %   MCV 87 79 - 97 fL   MCH 29.5 26.6 - 33.0 pg   MCHC 34.0 31.5 - 35.7 g/dL   RDW 62.1 30.8 - 65.7 %   Platelets 204 150 - 379 x10E3/uL   Neutrophils 53 Not Estab. %   Lymphs 35 Not Estab. %   Monocytes 10 Not Estab. %   Eos 1 Not Estab. %   Basos 1 Not Estab. %   Neutrophils  Absolute 2.6 1.4 - 7.0 x10E3/uL   Lymphocytes Absolute 1.7 0.7 - 3.1 x10E3/uL   Monocytes Absolute 0.5 0.1 - 0.9 x10E3/uL   EOS (ABSOLUTE) 0.1 0.0 - 0.4 x10E3/uL   Basophils Absolute 0.0 0.0 - 0.2 x10E3/uL   Immature Granulocytes 0 Not Estab. %   Immature Grans (Abs) 0.0 0.0 - 0.1 x10E3/uL  HIV antibody  Result Value Ref Range   HIV Screen 4th Generation wRfx Non Reactive Non Reactive  VDRL, Serum  Result Value Ref Range   Non-Treponemal Screening VDRL Normal     Patient does admit to excess alcohol last year but stopped this many months ago.  Has been gaining weight in the last 3 years.  Not exercising as much.  Admits to dietary indiscretions frequently  Long discussion held regarding anxiety.  Basically getting worse.  Now generalized in nature.  With element of depression.  See  PHQ 9 results.  Affecting patient's work.  Affecting patient's home and social involvement.  He is ready to make a change  Review of Systems No headache, no major weight loss or weight gain, no chest pain no back pain abdominal pain no change in bowel habits complete ROS otherwise negative     Objective:   Physical Exam  Alert and oriented, vitals reviewed and stable, NAD ENT-TM's and ext canals WNL bilat via otoscopic exam Soft palate, tonsils and post pharynx WNL via oropharyngeal exam Neck-symmetric, no masses; thyroid nonpalpable and nontender Pulmonary-no tachypnea or accessory muscle use; Clear without wheezes via auscultation Card--no abnrml murmurs, rhythm reg and rate WNL Carotid pulses symmetric, without bruits       Assessment & Plan:  Impression 1 elevated liver enzymes discussed at length will do appropriate blood work.  Will do ultrasound further recommendations based on results  2.  Generalized anxiety disorder worsening.  Multiple options discussed.  Will do attempt on Wellbutrin.  Patient also to contact [employer] at work for counseling.  Follow-up in 6 weeks  Greater than 50% of this  25 minute face to face visit was spent in counseling and discussion and coordination of care regarding the above diagnosis/diagnosies

## 2017-02-19 NOTE — Patient Instructions (Signed)

## 2017-02-20 ENCOUNTER — Telehealth: Payer: Self-pay | Admitting: Family Medicine

## 2017-02-20 NOTE — Telephone Encounter (Signed)
Pt prefers [facility]  U/S scheduled & pt aware

## 2017-02-20 NOTE — Telephone Encounter (Signed)
[facility] - need to discuss scheduling U/S  When? Where?

## 2017-02-25 ENCOUNTER — Ambulatory Visit (HOSPITAL_COMMUNITY)
Admission: RE | Admit: 2017-02-25 | Discharge: 2017-02-25 | Disposition: A | Discharge status: Home / Self Care | Payer: BLUE CROSS/BLUE SHIELD | Source: Ambulatory Visit | Attending: Family Medicine | Admitting: Family Medicine

## 2017-02-25 DIAGNOSIS — R748 Abnormal levels of other serum enzymes: Secondary | ICD-10-CM | POA: Insufficient documentation

## 2017-02-25 DIAGNOSIS — R7989 Other specified abnormal findings of blood chemistry: Secondary | ICD-10-CM | POA: Diagnosis not present

## 2017-02-26 LAB — FERRITIN: Ferritin: 194 ng/mL (ref 30–400)

## 2017-02-26 LAB — HEPATITIS C ANTIBODY

## 2017-02-26 LAB — HEPATITIS B SURFACE ANTIGEN: HEP B S AG: NEGATIVE

## 2017-02-26 LAB — IRON: Iron: 143 ug/dL (ref 38–169)

## 2017-03-18 DIAGNOSIS — M545 Low back pain: Secondary | ICD-10-CM | POA: Diagnosis not present

## 2017-03-18 DIAGNOSIS — M9902 Segmental and somatic dysfunction of thoracic region: Secondary | ICD-10-CM | POA: Diagnosis not present

## 2017-03-18 DIAGNOSIS — M9903 Segmental and somatic dysfunction of lumbar region: Secondary | ICD-10-CM | POA: Diagnosis not present

## 2017-03-18 DIAGNOSIS — M9905 Segmental and somatic dysfunction of pelvic region: Secondary | ICD-10-CM | POA: Diagnosis not present

## 2017-04-02 ENCOUNTER — Encounter: Payer: Self-pay | Admitting: Family Medicine

## 2017-04-02 ENCOUNTER — Ambulatory Visit (INDEPENDENT_AMBULATORY_CARE_PROVIDER_SITE_OTHER): Payer: BLUE CROSS/BLUE SHIELD | Admitting: Family Medicine

## 2017-04-02 VITALS — BP 132/84 | Ht 76.0 in | Wt 208.0 lb

## 2017-04-02 DIAGNOSIS — R748 Abnormal levels of other serum enzymes: Secondary | ICD-10-CM | POA: Diagnosis not present

## 2017-04-02 DIAGNOSIS — F411 Generalized anxiety disorder: Secondary | ICD-10-CM

## 2017-04-02 MED ORDER — BUPROPION HCL ER (SR) 150 MG PO TB12: 150.0000 mg | ORAL_TABLET | Freq: Two times a day (BID) | ORAL | 5 refills | Status: DC

## 2017-04-02 NOTE — Patient Instructions (Signed)
Please do liver enzyme test a couple weeks before visit

## 2017-04-02 NOTE — Progress Notes (Signed)
   Subjective:    Patient ID: John Holmes, male    DOB: 1990-10-19, 27 y.o.   MRN: [id number]  HPI Patient is here today to follow up on anxiety.patient is taking Wellbutrin 150 mg on BID.He states this medication is helping. He states he has not went to counseling as the wellbutrin seems to help. Work overall pretty good, still looking to transfer   Anxiety long hx of this, but has worsened with fathers recnt health troubles past  Yr   Wellbutrin overall well.  And doing well.  No obvious side effects  Did further blood work and ultrasound regarding liver enzymes C results  Review of Systems No headache, no major weight loss or weight gain, no chest pain no back pain abdominal pain no change in bowel habits complete ROS otherwise negative     Objective:   Physical Exam  Alert vitals stable, NAD. Blood pressure good on repeat. HEENT normal. Lungs clear. Heart regular rate and rhythm.      Assessment & Plan:  Impression generalized anxiety disorder.  Much improved on Wellbutrin long discussion held has had the symptoms for quite a while will maintain for now  2.  Elevated liver enzymes.  Ultrasound negative blood workup negative.  Repeat another set of tests in 6 months rationale discussed.  Follow-up in 6 months

## 2017-06-20 ENCOUNTER — Ambulatory Visit (INDEPENDENT_AMBULATORY_CARE_PROVIDER_SITE_OTHER): Payer: BLUE CROSS/BLUE SHIELD | Admitting: Family Medicine

## 2017-06-20 ENCOUNTER — Encounter: Payer: Self-pay | Admitting: Family Medicine

## 2017-06-20 VITALS — BP 134/80 | Temp 98.6°F | Ht 76.0 in | Wt 213.0 lb

## 2017-06-20 DIAGNOSIS — R21 Rash and other nonspecific skin eruption: Secondary | ICD-10-CM | POA: Diagnosis not present

## 2017-06-20 MED ORDER — TRIAMCINOLONE ACETONIDE 0.1 % EX CREA: 1.0000 "application " | TOPICAL_CREAM | Freq: Two times a day (BID) | CUTANEOUS | 1 refills | Status: DC

## 2017-06-20 MED ORDER — PREDNISONE 20 MG PO TABS: ORAL_TABLET | ORAL | 0 refills | Status: DC

## 2017-06-20 NOTE — Progress Notes (Signed)
   Subjective:    Patient ID: Memory ArgueBrandon T Renfrow, male    DOB: 02-16-1990, 27 y.o.   MRN: [id number]  Rash  This is a new problem. Episode onset: one week ago. Location: face, neck, shoulders, left arm and genital area. Treatments tried: rubbing alcohol, anti itch cream.    Noted itching  Had hx of rash on meck and saw derm in the past and got foam cream and it went away  Pt has been in the woods some lately  Recalls no exat exposure  Very itchy at times, off and on      More noticeable the last few days  Itching is really bad  Notes some rash on his pernis too    Sore on bottom of left foot. Came up about 2 months ago. Painful.   New soap ,      Review of Systems  Skin: Positive for rash.       Objective:   Physical Exam  Alert vitals stable, NAD. Blood pressure good on repeat. HEENT normal. Lungs clear. Heart regular rate and rhythm. Impressive maculopapular rash on neck and arms.  Some linear features.  Some on the trunk also.  Left plantar surface small area tender nodule      Assessment & Plan:  1 impression poison ivy/poison oak dermatitis fairly severe management discussed we will press on a prednisone taper.  We will  2.  Local callus versus plantar wart mainly towards plantar wart localized management discussed patient to attempt other interventions before dermatologist class

## 2017-08-16 DIAGNOSIS — Z0000 Encounter for general adult medical examination without abnormal findings: Secondary | ICD-10-CM | POA: Diagnosis not present

## 2017-08-26 ENCOUNTER — Telehealth: Payer: Self-pay | Admitting: Family Medicine

## 2017-08-26 NOTE — Telephone Encounter (Signed)
Patient is aware of all and has a follow up appt sched for 10/03/2017.

## 2017-08-26 NOTE — Telephone Encounter (Signed)
We saw pt in march and then was encouraged to f u in six mo in the offic for further assesment and discussion along with b w, does not need to do b w since we have this but definitely rec face to face o v next mo to disuss results, schd if not already done(let him know nothing serious)

## 2017-08-26 NOTE — Telephone Encounter (Signed)
Patient sent results to recent labs for you to review.  He was advised to check with PCP regarding this because there were results that were considered high.

## 2017-09-03 ENCOUNTER — Telehealth: Payer: Self-pay | Admitting: Family Medicine

## 2017-09-03 NOTE — Telephone Encounter (Signed)
Pt would like to talk with Dr. Brett CanalesSteve about him signing off for him to get a conceal carrier permit.

## 2017-09-04 NOTE — Telephone Encounter (Signed)
Patient advised In Dr Soyla DryerSteve's experience no, we have documented that his symtoms are well under control, it is up to the sherrifs office in each county [place], but Dr Brett CanalesSteve has known of plently of patient's under treatment with anxiety and depression qualify for conceal and carry. Patient verbalized understanding.

## 2017-09-04 NOTE — Telephone Encounter (Signed)
In y experience no, we have documented tht his symtoms are well under control, it is up to the sherrifs office in each [place] to [facility] but I have had plently of patients under treatmnt with anxiety and depr qualify for conceal and carry

## 2017-09-04 NOTE — Telephone Encounter (Signed)
Pt put in an application to have a conceal and carry and there were questions about depression on the form. He wanted to know if the conversions pt has had with you in the past would cause his application to be denied.

## 2017-09-04 NOTE — Telephone Encounter (Signed)
ntsw take detailed message

## 2017-10-03 ENCOUNTER — Ambulatory Visit: Payer: BLUE CROSS/BLUE SHIELD | Admitting: Family Medicine

## 2017-10-07 ENCOUNTER — Ambulatory Visit (INDEPENDENT_AMBULATORY_CARE_PROVIDER_SITE_OTHER): Payer: BLUE CROSS/BLUE SHIELD | Admitting: Family Medicine

## 2017-10-07 ENCOUNTER — Encounter: Payer: Self-pay | Admitting: Family Medicine

## 2017-10-07 VITALS — BP 126/78 | Ht 76.0 in | Wt 211.4 lb

## 2017-10-07 DIAGNOSIS — F411 Generalized anxiety disorder: Secondary | ICD-10-CM | POA: Diagnosis not present

## 2017-10-07 DIAGNOSIS — M545 Low back pain, unspecified: Secondary | ICD-10-CM

## 2017-10-07 DIAGNOSIS — G8929 Other chronic pain: Secondary | ICD-10-CM

## 2017-10-07 DIAGNOSIS — R7989 Other specified abnormal findings of blood chemistry: Secondary | ICD-10-CM

## 2017-10-07 DIAGNOSIS — R748 Abnormal levels of other serum enzymes: Secondary | ICD-10-CM | POA: Diagnosis not present

## 2017-10-07 MED ORDER — BUPROPION HCL ER (SR) 150 MG PO TB12: 150.0000 mg | ORAL_TABLET | Freq: Two times a day (BID) | ORAL | 5 refills | Status: DC

## 2017-10-07 NOTE — Progress Notes (Signed)
   Subjective:    Patient ID: John Holmes, male    DOB: 1990/09/20, 27 y.o.   MRN: [id number]  Anxiety  Presents for follow-up visit.    Patient presents with numerous concerns Pt here for 6 month follow up. Pt states he has question about test results he received from work. Pt is concerned about kidney disease.    Pt states he has been having sharp pain in kidneys, lingers, and hurts more if he is working out.pt has discomfort, primrily in the muscle area, pain in lower back, very painful sharp in nature, worse   More on right than left,  Area does not change majorly with motion or movement, a paulstating nature  Pt exrcising five d per wk  Pain rad into legs on occasion    Pt states he is not having trouble urinating but can be constipated at times.  Takes minima meds for it   HR low, concerned about it    Chronic anxiety overall a lot btter on     Review of Systems No headache, no major weight loss or weight gain, no chest pain no back pain abdominal pain no change in bowel habits complete ROS otherwise negative     Objective:   Physical Exam  Alert and oriented, vitals reviewed and stable, NAD ENT-TM's and ext canals WNL bilat via otoscopic exam Soft palate, tonsils and post pharynx WNL via oropharyngeal exam Neck-symmetric, no masses; thyroid nonpalpable and nontender Pulmonary-no tachypnea or accessory muscle use; Clear without wheezes via auscultation Card--no abnrml murmurs, rhythm reg and rate WNL Carotid pulses symmetric, without bruits Right lower lumbar tenderness to deep palpation      Assessment & Plan:  Impression chronic lumbar strain.  Highly doubt association #2 we discussed rationale discussed with the medication.  2.  Mildly elevated creatinine with completely normal GFR.  Discussed.  Recommend no further testing or work-up at this time.  Yearly blood work suggested rationale discussed  3.  Chronic generalized anxiety discussed  tolerating meds well to maintain same graft diet exercise discussed medication refill/follow-up in 6 months

## 2018-03-04 ENCOUNTER — Encounter: Payer: Self-pay | Admitting: Family Medicine

## 2018-03-04 ENCOUNTER — Ambulatory Visit (INDEPENDENT_AMBULATORY_CARE_PROVIDER_SITE_OTHER): Payer: BLUE CROSS/BLUE SHIELD | Admitting: Family Medicine

## 2018-03-04 VITALS — BP 132/90 | Ht 76.0 in | Wt 208.1 lb

## 2018-03-04 DIAGNOSIS — Z0000 Encounter for general adult medical examination without abnormal findings: Secondary | ICD-10-CM

## 2018-03-04 DIAGNOSIS — Z79899 Other long term (current) drug therapy: Secondary | ICD-10-CM

## 2018-03-04 DIAGNOSIS — F411 Generalized anxiety disorder: Secondary | ICD-10-CM

## 2018-03-04 DIAGNOSIS — R5383 Other fatigue: Secondary | ICD-10-CM | POA: Diagnosis not present

## 2018-03-04 DIAGNOSIS — R748 Abnormal levels of other serum enzymes: Secondary | ICD-10-CM

## 2018-03-04 DIAGNOSIS — Z113 Encounter for screening for infections with a predominantly sexual mode of transmission: Secondary | ICD-10-CM

## 2018-03-04 MED ORDER — BUPROPION HCL ER (SR) 150 MG PO TB12: 150.0000 mg | ORAL_TABLET | Freq: Two times a day (BID) | ORAL | 5 refills | Status: DC

## 2018-03-04 NOTE — Progress Notes (Signed)
Subjective:    Patient ID: John Holmes, male    DOB: 1990/04/13, 28 y.o.   MRN: [id number]  HPI Patient is here today to follow up on chronic health issues and his annual exam.  Gad: He takes Wellbutrin 150 mg one po bid.  The patient comes in today for a wellness visit.  A review of their health history was completed.  A review of medications was also completed.  Any needed refills; Yes  Eating habits: Healthy  Falls/  MVA accidents in past few months: none  Regular exercise: Yes  Specialist pt sees on regular basis: No  Preventative health issues were discussed.   Additional concerns:Would like std panel done today.  Diet excellent during the week, ok on the weekend    eecsign a lot and faithfully   Still having some ongoing concerns regarding potential std's  Would like to be screenec for std s   Notes occas fine tremor while on thre wellbutrin. Notes when he takes an iron pill notes improved thought pattern and clearer thought   Review of Systems  Constitutional: Negative for activity change, appetite change and fever.  HENT: Negative for congestion and rhinorrhea.   Eyes: Negative for discharge.  Respiratory: Negative for cough and wheezing.   Cardiovascular: Negative for chest pain.  Gastrointestinal: Negative for abdominal pain, blood in stool and vomiting.  Genitourinary: Negative for difficulty urinating and frequency.  Musculoskeletal: Negative for neck pain.  Skin: Negative for rash.  Allergic/Immunologic: Negative for environmental allergies and food allergies.  Neurological: Negative for weakness and headaches.  Psychiatric/Behavioral: Negative for agitation.  All other systems reviewed and are negative.      Objective:   Physical Exam Vitals signs reviewed.  Constitutional:      Appearance: He is well-developed.  HENT:     Head: Normocephalic and atraumatic.     Right Ear: External ear normal.     Left Ear: External ear normal.      Nose: Nose normal.  Eyes:     Pupils: Pupils are equal, round, and reactive to light.  Neck:     Musculoskeletal: Normal range of motion and neck supple.     Thyroid: No thyromegaly.  Cardiovascular:     Rate and Rhythm: Normal rate and regular rhythm.     Heart sounds: Normal heart sounds. No murmur.  Pulmonary:     Effort: Pulmonary effort is normal. No respiratory distress.     Breath sounds: Normal breath sounds. No wheezing.  Abdominal:     General: Bowel sounds are normal. There is no distension.     Palpations: Abdomen is soft. There is no mass.     Tenderness: There is no abdominal tenderness.  Genitourinary:    Penis: Normal.   Musculoskeletal: Normal range of motion.  Lymphadenopathy:     Cervical: No cervical adenopathy.  Skin:    General: Skin is warm and dry.     Findings: No erythema.  Neurological:     Mental Status: He is alert.     Motor: No abnormal muscle tone.  Psychiatric:        Behavior: Behavior normal.        Judgment: Judgment normal.           Assessment & Plan:  Impression wellness exam.  Diet discussed.  Exercise discussed.  Appropriate blood work discussed.  Also liver enzymes to be assessed.  Also STDs blood work per patient request.  Also STD swabs per patient  request  2.  Generalized anxiety disorder.  Clinically stable.  Medications refilled.  Further recommendations based on blood work diet exercise discussed

## 2018-03-05 LAB — HIV ANTIBODY (ROUTINE TESTING W REFLEX): HIV Screen 4th Generation wRfx: NONREACTIVE

## 2018-03-05 LAB — CBC WITH DIFFERENTIAL/PLATELET
Basophils Absolute: 0 10*3/uL (ref 0.0–0.2)
Basos: 1 %
EOS (ABSOLUTE): 0 10*3/uL (ref 0.0–0.4)
Eos: 0 %
Hematocrit: 43.8 % (ref 37.5–51.0)
Hemoglobin: 14.8 g/dL (ref 13.0–17.7)
Immature Grans (Abs): 0 10*3/uL (ref 0.0–0.1)
Immature Granulocytes: 0 %
Lymphocytes Absolute: 1.6 10*3/uL (ref 0.7–3.1)
Lymphs: 20 %
MCH: 30.5 pg (ref 26.6–33.0)
MCHC: 33.8 g/dL (ref 31.5–35.7)
MCV: 90 fL (ref 79–97)
Monocytes Absolute: 0.7 10*3/uL (ref 0.1–0.9)
Monocytes: 9 %
NEUTROS ABS: 5.7 10*3/uL (ref 1.4–7.0)
Neutrophils: 70 %
Platelets: 188 10*3/uL (ref 150–450)
RBC: 4.86 x10E6/uL (ref 4.14–5.80)
RDW: 13.2 % (ref 11.6–15.4)
WBC: 8.1 10*3/uL (ref 3.4–10.8)

## 2018-03-05 LAB — HEPATIC FUNCTION PANEL
ALT: 36 IU/L (ref 0–44)
AST: 28 IU/L (ref 0–40)
Albumin: 4.9 g/dL (ref 4.1–5.2)
Alkaline Phosphatase: 41 IU/L (ref 39–117)
Bilirubin Total: 0.7 mg/dL (ref 0.0–1.2)
Bilirubin, Direct: 0.18 mg/dL (ref 0.00–0.40)
Total Protein: 6.9 g/dL (ref 6.0–8.5)

## 2018-03-05 LAB — BASIC METABOLIC PANEL
BUN/Creatinine Ratio: 12 (ref 9–20)
BUN: 14 mg/dL (ref 6–20)
CO2: 24 mmol/L (ref 20–29)
Calcium: 9.9 mg/dL (ref 8.7–10.2)
Chloride: 103 mmol/L (ref 96–106)
Creatinine, Ser: 1.2 mg/dL (ref 0.76–1.27)
GFR calc Af Amer: 96 mL/min/{1.73_m2} (ref >59)
GFR calc non Af Amer: 83 mL/min/{1.73_m2} (ref >59)
Glucose: 94 mg/dL (ref 65–99)
Potassium: 4.6 mmol/L (ref 3.5–5.2)
Sodium: 143 mmol/L (ref 134–144)

## 2018-03-05 LAB — LIPID PANEL
CHOL/HDL RATIO: 2.3 ratio (ref 0.0–5.0)
CHOLESTEROL TOTAL: 123 mg/dL (ref 100–199)
HDL: 53 mg/dL (ref >39)
LDL CALC: 56 mg/dL (ref 0–99)
TRIGLYCERIDES: 72 mg/dL (ref 0–149)
VLDL Cholesterol Cal: 14 mg/dL (ref 5–40)

## 2018-03-05 LAB — FERRITIN: Ferritin: 208 ng/mL (ref 30–400)

## 2018-03-05 LAB — RPR: RPR Ser Ql: NONREACTIVE

## 2018-03-06 LAB — GC/CHLAMYDIA PROBE AMP
Chlamydia trachomatis, NAA: NEGATIVE
Neisseria gonorrhoeae by PCR: NEGATIVE

## 2018-03-07 ENCOUNTER — Encounter: Payer: Self-pay | Admitting: Family Medicine

## 2018-03-12 ENCOUNTER — Telehealth: Payer: Self-pay | Admitting: Family Medicine

## 2018-03-12 NOTE — Telephone Encounter (Signed)
Was seen 03/04/2018 for wellness. Please advise.

## 2018-03-12 NOTE — Telephone Encounter (Signed)
Pt would like to ask Dr. Brett Canales about the possibility of him having early stages of parkinson's. He hasn't not received his lab results yet but he is stating he has been on google and the symptoms he is having he is thinking its related to parkinson. His symptoms are shaking, anxiety, and depression.   I offered pt an appt today with Dr. Brett Canales to discuss but he felt a note going back would be sufficient. CB# [phone].

## 2018-03-13 NOTE — Telephone Encounter (Signed)
As far as bw tell Pt had note sent on the 21st need to make sure has our address., re send if necessary I do not feel he has parkionsons at all, however, I am not always successful when up against "Dr Waverly Ferrari", will be happy to refer to a neurologist for a specialist's opinio, if pt agrees, plz do

## 2018-03-13 NOTE — Telephone Encounter (Signed)
Left message to return call 

## 2018-03-14 ENCOUNTER — Other Ambulatory Visit: Payer: Self-pay | Admitting: Family Medicine

## 2018-03-14 DIAGNOSIS — R251 Tremor, unspecified: Secondary | ICD-10-CM

## 2018-03-14 NOTE — Telephone Encounter (Signed)
Blood work results were mailed out on 2/21 and again on 2/28.

## 2018-03-14 NOTE — Telephone Encounter (Signed)
Patient returning phone call, please return phone call at number listed.

## 2018-03-14 NOTE — Telephone Encounter (Signed)
Contacted patient. Pt states he has not received letter at this time. Address on file is correct. Will mail again. Pt would like referral to neurology for specialist opinion. Referral placed. Pt states he is in the middle of relocating. Referral put in for [facility] per patient request. Pt verbalized understanding. Please re-mail letter. Thank you

## 2018-03-20 ENCOUNTER — Encounter: Payer: Self-pay | Admitting: Family Medicine

## 2018-03-20 ENCOUNTER — Encounter: Payer: Self-pay | Admitting: Neurology

## 2018-04-07 ENCOUNTER — Ambulatory Visit: Payer: BLUE CROSS/BLUE SHIELD | Admitting: Neurology

## 2018-04-14 ENCOUNTER — Encounter: Payer: BLUE CROSS/BLUE SHIELD | Admitting: Family Medicine

## 2018-07-02 ENCOUNTER — Other Ambulatory Visit: Payer: Self-pay

## 2018-07-02 ENCOUNTER — Ambulatory Visit (INDEPENDENT_AMBULATORY_CARE_PROVIDER_SITE_OTHER): Payer: BC Managed Care – PPO | Admitting: Family Medicine

## 2018-07-02 DIAGNOSIS — F411 Generalized anxiety disorder: Secondary | ICD-10-CM | POA: Diagnosis not present

## 2018-07-02 MED ORDER — SERTRALINE HCL 50 MG PO TABS: 50.0000 mg | ORAL_TABLET | Freq: Every day | ORAL | 5 refills | Status: DC

## 2018-07-02 NOTE — Progress Notes (Signed)
   Subjective:    Patient ID: John Holmes, male    DOB: Jun 02, 1990, 28 y.o.   MRN: [id number] Audio plus video HPI  Patient would like to discuss switching from the Wellbutrin to Zoloft. Patient states he stopped the Wellbutrin a month ago because he was having twitching movements when taking it.   Virtual Visit via Video Note  I connected with RITTER HELSLEY on 07/02/18 at  2:00 PM EDT by a video enabled telemedicine application and verified that I am speaking with the correct person using two identifiers.  Location: Patient: home Provider: office   I discussed the limitations of evaluation and management by telemedicine and the availability of in person appointments. The patient expressed understanding and agreed to proceed.  History of Present Illness:    Observations/Objective:   Assessment and Plan:   Follow Up Instructions:    I discussed the assessment and treatment plan with the patient. The patient was provided an opportunity to ask questions and all were answered. The patient agreed with the plan and demonstrated an understanding of the instructions.   The patient was advised to call back or seek an in-person evaluation if the symptoms worsen or if the condition fails to improve as anticipated.  I provided 40minutes of non-face-to-face time during this encounter. Patient still having substantial challenges with anxiety.  Particularly involving having to interact with new people.  Ongoing challenges with this.  Medication was helping but he developed what he felt were neuro muscular tics secondary to the Wellbutrin.  Patient would like to try Zoloft.  He is heard some good things about it.  He is hopeful this will help with his anxiety.    Review of Systems No headache, no major weight loss or weight gain, no chest pain no back pain abdominal pain no change in bowel habits complete ROS otherwise negative     Objective:   Physical Exam  Virtual      Assessment & Plan:  Impression generalized anxiety disorder with apparent potential side effects secondary to Wellbutrin.  Wellbutrin already stopped.  Initiate Zoloft.  Rationale discussed.  Follow-up in 6 months

## 2018-07-04 ENCOUNTER — Encounter: Payer: Self-pay | Admitting: Family Medicine

## 2018-07-29 NOTE — Progress Notes (Signed)
Memory ArgueBrandon T Portnoy was seen today in the movement disorders clinic for neurologic consultation at the request of Luking, Vilinda BlanksWilliam S, MD.  The consultation is for the evaluation of possible PD.  The records that were made available to me were reviewed but no notes were seen regarding the diagnosis.  Pt does have longstanding anxiety d/o and appears that the patient was worried about having PD.  Tremor: "Occasionally" - its been going on for a "really long time" - perhaps started in middle or high school  - can be right or left hand - usually with eating.  Sometimes will notice some restless or shakiness in the legs - esp in the bed  Fam hx of tremor?  Yes.  , brother has "shakiness" but no diagnosis  Affected by caffeine:  n/a (doesn't drink alcohol)  Affected by alcohol:  Lessens it (drinks 2 times per month)  Affected by stress:  Yes.  , makes it worse  Affected by fatigue:  No.  Spills soup if on spoon:  No.  Spills glass of liquid if full:  No.  Affects ADL's (tying shoes, brushing teeth, etc):  No.  Tremor inducing meds:  No.  Other Specific Symptoms:  Voice: its "dampened" - people ask me to speak up Sleep: "i'm restless"  Vivid Dreams:  No.  Acting out dreams:  Sleep talks some Wet Pillows: Yes.  , some drool at night Postural symptoms:  Yes.   - " I have to rebalance myself"  Falls?  No. Bradykinesia symptoms: no bradykinesia noted Loss of smell:  Yes.   Loss of taste:  No. Urinary Incontinence:  No. Difficulty Swallowing:  No. Handwriting, micrographia: Yes.   Depression:  Yes.   Memory changes:  Yes.    (short > long term; trouble with naming) Hallucinations:  Yes.  , rarely will see person not there  visual distortions: Yes.   N/V:  No. Lightheaded:  Yes.    Syncope: No. Diplopia:  No. Dyskinesia:  No.   Last neuroimaging was completed in 2004 and was a CT of the brain and was reported to be normal.  The images are no longer available.  PREVIOUS MEDICATIONS: none  to date  ALLERGIES:  No Known Allergies  CURRENT MEDICATIONS:  Outpatient Encounter Medications as of 08/04/2018  Medication Sig  . sertraline (ZOLOFT) 50 MG tablet Take 1 tablet (50 mg total) by mouth daily.   No facility-administered encounter medications on file as of 08/04/2018.     PAST MEDICAL HISTORY:   Past Medical History:  Diagnosis Date  . Anxiety   . Depression     PAST SURGICAL HISTORY:   Past Surgical History:  Procedure Laterality Date  . KNEE ARTHROSCOPY W/ MENISCAL REPAIR Right     SOCIAL HISTORY:   Social History   Socioeconomic History  . Marital status: Single    Spouse name: Not on file  . Number of children: 0  . Years of education: Not on file  . Highest education level: Bachelor's degree (e.g., BA, AB, BS)  Occupational History  . Not on file  Social Needs  . Financial resource strain: Not on file  . Food insecurity    Worry: Not on file    Inability: Not on file  . Transportation needs    Medical: Not on file    Non-medical: Not on file  Tobacco Use  . Smoking status: Never Smoker  . Smokeless tobacco: Never Used  Substance and Sexual Activity  .  Alcohol use: Yes    Alcohol/week: 0.0 standard drinks    Comment: drinks 2 bottles (12 oz) occasionally- socially  . Drug use: No  . Sexual activity: Yes    Partners: Female    Birth control/protection: Condom  Lifestyle  . Physical activity    Days per week: Not on file    Minutes per session: Not on file  . Stress: Not on file  Relationships  . Social Herbalist on phone: Not on file    Gets together: Not on file    Attends religious service: Not on file    Active member of club or organization: Not on file    Attends meetings of clubs or organizations: Not on file    Relationship status: Not on file  . Intimate partner violence    Fear of current or ex partner: Not on file    Emotionally abused: Not on file    Physically abused: Not on file    Forced sexual activity:  Not on file  Other Topics Concern  . Not on file  Social History Narrative  . Not on file    FAMILY HISTORY:   Family Status  Relation Name Status  . Mother  Alive  . Father  Alive  . Sister  Alive  . Brother  Alive  . PGM  Alive    ROS:  Review of Systems  Constitutional: Positive for malaise/fatigue.  HENT: Negative.   Eyes: Negative.   Respiratory: Positive for shortness of breath (intermittent).   Gastrointestinal: Positive for constipation.  Genitourinary: Negative.   Musculoskeletal: Positive for back pain (lower back).  Skin: Negative.   Endo/Heme/Allergies: Negative.   Psychiatric/Behavioral: Positive for depression, hallucinations and memory loss. The patient is nervous/anxious.     PHYSICAL EXAMINATION:    VITALS:   Vitals:   08/04/18 0902  BP: 127/68  Pulse: (!) 56  Temp: 98.6 F (37 C)  SpO2: 99%  Weight: 214 lb 3.2 oz (97.2 kg)  Height: 6' 3.5" (1.918 m)    GEN:  The patient appears stated age and is in NAD. HEENT:  Normocephalic, atraumatic.  The mucous membranes are moist. The superficial temporal arteries are without ropiness or tenderness. CV:  Bradycardic.  Regular Lungs:  CTAB Neck/HEME:  There are no carotid bruits bilaterally.  Neurological examination:  Orientation: The patient is alert and oriented x3. Fund of knowledge is appropriate.  Recent and remote memory are intact.  Attention and concentration are normal.    Able to name objects and repeat phrases. Cranial nerves: There is good facial symmetry.  Extraocular muscles are intact. The visual fields are full to confrontational testing. The speech is fluent and clear. Soft palate rises symmetrically and there is no tongue deviation. Hearing is intact to conversational tone. Sensation: Sensation is intact to light and pinprick throughout (facial, trunk, extremities). Vibration is intact at the bilateral big toe. There is no extinction with double simultaneous stimulation. There is no  sensory dermatomal level identified. Motor: Strength is 5/5 in the bilateral upper and lower extremities.   Shoulder shrug is equal and symmetric.  There is no pronator drift. Deep tendon reflexes: Deep tendon reflexes are 2+/4 at the bilateral biceps, triceps, brachioradialis, patella and achilles. Plantar responses are downgoing bilaterally.  Movement examination: Tone: There is normal tone in the bilateral upper extremities.  The tone in the lower extremities is normal.  Abnormal movements: There is no rest tremor.  There is no postural tremor.  There is no intention tremor.  There is no tremor when given a weight.  He has no trouble with pouring water from one glass to another. Coordination:  There is no decremation with RAM's, with any form of RAMS, including alternating supination and pronation of the forearm, hand opening and closing, finger taps, heel taps and toe taps. Gait and Station: The patient has no difficulty arising out of a deep-seated chair without the use of the hands. The patient's stride length is normal.  The patient is able to ambulate in a tandem fashion.  He is able to heel toe walk.  He is able to stand in the Romberg position.    Chemistry      Component Value Date/Time   NA 143 03/04/2018 1517   K 4.6 03/04/2018 1517   CL 103 03/04/2018 1517   CO2 24 03/04/2018 1517   BUN 14 03/04/2018 1517   CREATININE 1.20 03/04/2018 1517      Component Value Date/Time   CALCIUM 9.9 03/04/2018 1517   ALKPHOS 41 03/04/2018 1517   AST 28 03/04/2018 1517   ALT 36 03/04/2018 1517   BILITOT 0.7 03/04/2018 1517     Lab Results  Component Value Date   TSH 1.260 02/06/2017   No results found for: VITAMINB12   Lab Results  Component Value Date   WBC 8.1 03/04/2018   HGB 14.8 03/04/2018   HCT 43.8 03/04/2018   MCV 90 03/04/2018   PLT 188 03/04/2018      ASSESSMENT/PLAN:  1.  Tremor, by history  -I saw no tremor on the patient's examination today.  -Reassured the  patient that I saw no evidence of Parkinson's disease.  -Do think it would be reasonable to repeat his TSH.  It has been over a year and a half since that has been completed.  i'm also going to check his B12  -we will go ahead and do an MRI brain without gad given some hallucinations just to make sure no medical/neurologic reason for this.  If that is negative, then I will return the patient back to the primary care physician for further medical and psychiatric work-up.  2.  We will call him with results of the above and follow up will depend on results of the above.  Cc:  Merlyn AlbertLuking, William S, MD

## 2018-08-04 ENCOUNTER — Other Ambulatory Visit: Payer: Self-pay

## 2018-08-04 ENCOUNTER — Ambulatory Visit (INDEPENDENT_AMBULATORY_CARE_PROVIDER_SITE_OTHER): Payer: BC Managed Care – PPO | Admitting: Neurology

## 2018-08-04 ENCOUNTER — Encounter: Payer: Self-pay | Admitting: Neurology

## 2018-08-04 ENCOUNTER — Other Ambulatory Visit (INDEPENDENT_AMBULATORY_CARE_PROVIDER_SITE_OTHER): Payer: BC Managed Care – PPO

## 2018-08-04 VITALS — BP 127/68 | HR 56 | Temp 98.6°F | Ht 75.5 in | Wt 214.2 lb

## 2018-08-04 DIAGNOSIS — R441 Visual hallucinations: Secondary | ICD-10-CM

## 2018-08-04 DIAGNOSIS — R251 Tremor, unspecified: Secondary | ICD-10-CM

## 2018-08-04 DIAGNOSIS — R5383 Other fatigue: Secondary | ICD-10-CM

## 2018-08-04 LAB — TSH: TSH: 0.85 u[IU]/mL (ref 0.35–4.50)

## 2018-08-04 NOTE — Patient Instructions (Signed)
Your provider has requested that you have labwork completed today. Please go to [facility] ([id number]) on the second floor of this building before leaving the office today. You do not need to check in. If you are not called within 15 minutes please check with the front desk.   A referral to [facility] has been placed for your MRI someone will contact you directly to schedule your appt. They are located at [address] Please contact them directly by calling [phone] with any questions regarding your referral. 

## 2018-08-05 ENCOUNTER — Telehealth: Payer: Self-pay

## 2018-08-05 LAB — VITAMIN B12: Vitamin B-12: 303 pg/mL (ref 211–911)

## 2018-08-05 NOTE — Telephone Encounter (Signed)
-----   Message from [place], DO sent at 08/05/2018 12:36 PM EDT ----- Let pt know that b12 just a tad low and to take oral b12 supplement, 1093mcg daily

## 2018-08-05 NOTE — Telephone Encounter (Signed)
Spoke with patient he was informed of results.  

## 2018-08-30 ENCOUNTER — Other Ambulatory Visit: Payer: Self-pay

## 2018-08-30 ENCOUNTER — Other Ambulatory Visit: Payer: BC Managed Care – PPO

## 2018-08-30 ENCOUNTER — Ambulatory Visit
Admission: RE | Admit: 2018-08-30 | Discharge: 2018-08-30 | Disposition: A | Discharge status: Home / Self Care | Payer: BC Managed Care – PPO | Source: Ambulatory Visit | Attending: Neurology | Admitting: Neurology

## 2018-08-30 DIAGNOSIS — R251 Tremor, unspecified: Secondary | ICD-10-CM

## 2018-08-30 DIAGNOSIS — R441 Visual hallucinations: Secondary | ICD-10-CM

## 2018-08-30 DIAGNOSIS — R5383 Other fatigue: Secondary | ICD-10-CM

## 2018-09-01 ENCOUNTER — Telehealth: Payer: Self-pay

## 2018-09-01 NOTE — Telephone Encounter (Signed)
Called patient he was made aware of results

## 2018-09-01 NOTE — Telephone Encounter (Signed)
-----   Message from [place], DO sent at 09/01/2018  7:39 AM EDT ----- Let pt know that I personally reviewed MRI brain and it is normal

## 2018-09-01 NOTE — Telephone Encounter (Signed)
Patient left msg with after hours returning your call. Thanks!  °

## 2018-09-01 NOTE — Telephone Encounter (Signed)
Called patient no answer left message to call office back for results. 

## 2019-02-16 ENCOUNTER — Encounter: Payer: Self-pay | Admitting: Family Medicine

## 2019-04-09 ENCOUNTER — Ambulatory Visit (INDEPENDENT_AMBULATORY_CARE_PROVIDER_SITE_OTHER): Payer: BC Managed Care – PPO | Admitting: Family Medicine

## 2019-04-09 ENCOUNTER — Other Ambulatory Visit: Payer: Self-pay

## 2019-04-09 DIAGNOSIS — F411 Generalized anxiety disorder: Secondary | ICD-10-CM | POA: Diagnosis not present

## 2019-04-09 MED ORDER — SERTRALINE HCL 100 MG PO TABS: ORAL_TABLET | ORAL | 5 refills | Status: DC

## 2019-04-09 NOTE — Progress Notes (Signed)
   Subjective:  Audio and video  Patient ID: John Holmes, male    DOB: [birth year], 29 y.o.   MRN: [id number]  HPI Pt needing follow up on Zoloft 50 mg. Pt states he is doing OK on med but would like to increase the dose. Pt not having any side effects from meds. PHQ-9 and GAD 7 complete.   Virtual Visit via Telephone Note  I connected with John Holmes on 04/09/19 at  8:20 AM EDT by telephone and verified that I am speaking with the correct person using two identifiers.  Location: Patient: home Provider: office   I discussed the limitations, risks, security and privacy concerns of performing an evaluation and management service by telephone and the availability of in person appointments. I also discussed with the patient that there may be a patient responsible charge related to this service. The patient expressed understanding and agreed to proceed.     History of Present Illness:    Observations/Objective:   Assessment and Plan:   Follow Up Instructions:    I discussed the assessment and treatment plan with the patient. The patient was provided an opportunity to ask questions and all were answered. The patient agreed with the plan and demonstrated an understanding of the instructions.   The patient was advised to call back or seek an in-person evaluation if the symptoms worsen or if the condition fails to improve as anticipated.  I provided 23 minutes of non-face-to-face time during this encounter.       Review of Systems No headache, no major weight loss or weight gain, no chest pain no back pain abdominal pain no change in bowel habits complete ROS otherwise negative     Objective:   Physical Exam  Virtual      Assessment & Plan:  Impression generalized anxiety disorder.  PHQ-9 and GAD-7 reviewed suboptimal.  Discussed.  Will increase Zoloft from 50-100.  Exercise encouraged.  Patient continues to remain virtual in his work.  Follow-up in 6  months.

## 2019-10-16 ENCOUNTER — Telehealth: Payer: Self-pay

## 2019-10-16 MED ORDER — SERTRALINE HCL 100 MG PO TABS: ORAL_TABLET | ORAL | 0 refills | Status: DC

## 2019-10-16 NOTE — Telephone Encounter (Signed)
Pt has appt to est care on 10/27. He is needing a refill on sertraline (ZOLOFT) 100 MG tablet   He is out of medication today.   [facility] - [place], [place] - [address]

## 2019-10-16 NOTE — Telephone Encounter (Signed)
Prescription sent electronically to pharmacy. Patient notified. 

## 2019-11-11 ENCOUNTER — Ambulatory Visit: Payer: BC Managed Care – PPO | Admitting: Family Medicine

## 2019-11-16 ENCOUNTER — Other Ambulatory Visit: Payer: Self-pay

## 2019-11-16 NOTE — Telephone Encounter (Signed)
Pt needs refill on sertraline (ZOLOFT) 100 MG tablet Pt has Phy 11/8 [facility] [address] [place], [place] - [address]  [address], [place] [address]   Pt [phone]

## 2019-11-17 MED ORDER — SERTRALINE HCL 100 MG PO TABS: ORAL_TABLET | ORAL | 0 refills | Status: DC

## 2019-11-23 ENCOUNTER — Encounter: Payer: BC Managed Care – PPO | Admitting: Family Medicine

## 2019-12-08 ENCOUNTER — Encounter: Payer: BC Managed Care – PPO | Admitting: Family Medicine

## 2019-12-09 ENCOUNTER — Other Ambulatory Visit: Payer: Self-pay

## 2019-12-09 MED ORDER — SERTRALINE HCL 100 MG PO TABS: ORAL_TABLET | ORAL | 0 refills | Status: DC

## 2019-12-09 NOTE — Telephone Encounter (Signed)
Pt is needing refill on sertraline (ZOLOFT) 100 MG tablet [facility] - [place], [place] [address]   Pt call back [phone]

## 2019-12-29 ENCOUNTER — Encounter: Payer: Self-pay | Admitting: Family Medicine

## 2019-12-29 ENCOUNTER — Ambulatory Visit (INDEPENDENT_AMBULATORY_CARE_PROVIDER_SITE_OTHER): Payer: BC Managed Care – PPO | Admitting: Family Medicine

## 2019-12-29 VITALS — BP 114/72 | HR 60 | Temp 97.6°F | Ht 74.0 in | Wt 236.2 lb

## 2019-12-29 DIAGNOSIS — Z0000 Encounter for general adult medical examination without abnormal findings: Secondary | ICD-10-CM

## 2019-12-29 DIAGNOSIS — Z113 Encounter for screening for infections with a predominantly sexual mode of transmission: Secondary | ICD-10-CM | POA: Diagnosis not present

## 2019-12-29 DIAGNOSIS — F419 Anxiety disorder, unspecified: Secondary | ICD-10-CM

## 2019-12-29 MED ORDER — SERTRALINE HCL 100 MG PO TABS: ORAL_TABLET | ORAL | 1 refills | Status: AC

## 2019-12-29 NOTE — Progress Notes (Signed)
Patient ID: John Holmes, male    DOB: 01-09-1991, 29 y.o.   MRN: [id number]   Chief Complaint  Patient presents with  . Annual Exam   Subjective:    HPI  The patient comes in today for a wellness visit.  A review of their health history was completed.  A review of medications was also completed.  Any needed refills; none at this time  Eating habits: healthy eating   Falls/  MVA accidents in past few months: none  Regular exercise: jogging/sprinting/ weight training   Specialist pt sees on regular basis: none  Preventative health issues were discussed.   Additional concerns: STD testing   Pt doing well and taking zoloft and improved with anxiety.  DM2 and htn on father side of family.  Wanting blood and urine testing for stds today.  No concerns. No active lesions, dysuria, or penile discharge.  Medical History John Holmes has a past medical history of Anxiety and Depression.   Outpatient Encounter Medications as of 12/29/2019  Medication Sig  . [DISCONTINUED] sertraline (ZOLOFT) 100 MG tablet Take one tablet po daily  . sertraline (ZOLOFT) 100 MG tablet Take one tablet po daily   No facility-administered encounter medications on file as of 12/29/2019.     Review of Systems  Constitutional: Negative for chills and fever.  HENT: Negative for congestion, rhinorrhea and sore throat.   Respiratory: Negative for cough, shortness of breath and wheezing.   Cardiovascular: Negative for chest pain and leg swelling.  Gastrointestinal: Negative for abdominal pain, diarrhea, nausea and vomiting.  Genitourinary: Negative for dysuria and frequency.  Skin: Negative for rash.  Neurological: Negative for dizziness, weakness and headaches.     Vitals BP 114/72   Pulse 60   Temp 97.6 F (36.4 C)   Ht 6' 2"  (1.88 m)   Wt 236 lb 3.2 oz (107.1 kg)   SpO2 96%   BMI 30.33 kg/m   Objective:   Physical Exam Vitals and nursing note reviewed.  Constitutional:       General: He is not in acute distress.    Appearance: Normal appearance. He is not ill-appearing.  HENT:     Head: Normocephalic.     Nose: Nose normal. No congestion.     Mouth/Throat:     Mouth: Mucous membranes are moist.     Pharynx: No oropharyngeal exudate.  Eyes:     Extraocular Movements: Extraocular movements intact.     Conjunctiva/sclera: Conjunctivae normal.     Pupils: Pupils are equal, round, and reactive to light.  Cardiovascular:     Rate and Rhythm: Normal rate and regular rhythm.     Pulses: Normal pulses.     Heart sounds: Normal heart sounds. No murmur heard.   Pulmonary:     Effort: Pulmonary effort is normal.     Breath sounds: Normal breath sounds. No wheezing, rhonchi or rales.  Musculoskeletal:        General: Normal range of motion.     Right lower leg: No edema.     Left lower leg: No edema.  Skin:    General: Skin is warm and dry.     Findings: No rash.  Neurological:     General: No focal deficit present.     Mental Status: He is alert and oriented to person, place, and time.     Cranial Nerves: No cranial nerve deficit.  Psychiatric:        Mood and Affect: Mood normal.  Behavior: Behavior normal.        Thought Content: Thought content normal.        Judgment: Judgment normal.      Assessment and Plan   1. Well adult exam  2. Screen for STD (sexually transmitted disease) - HSV(herpes simplex vrs) 1+2 ab-IgG - HSV(herpes simplex vrs) 1+2 ab-IgM - HIV antibody (with reflex) - Hepatitis C Antibody - RPR - Chlamydia/Gonococcus/Trichomonas, NAA  3. Laboratory tests ordered as part of a complete physical exam (CPE) - CBC - CMP14+EGFR - Lipid panel  4. Anxiety - sertraline (ZOLOFT) 100 MG tablet; Take one tablet po daily  Dispense: 90 tablet; Refill: 1   F/u 54mofor f/u on anxiety and in 1 yr for wellness visit.

## 2020-01-10 ENCOUNTER — Encounter: Payer: Self-pay | Admitting: Family Medicine

## 2020-01-15 IMAGING — MR MRI HEAD WITHOUT CONTRAST
9 series · 48 of 48 positions shown · non-contrast
Comparison: None.

CLINICAL DATA: Tremor.

EXAM:
MRI HEAD WITHOUT CONTRAST
TECHNIQUE: Multiplanar, multiecho pulse sequences of the brain and surrounding
structures were obtained without intravenous contrast.

[Series 2: T1 · sagittal · 5.0mm · 0.45mm/px · 3 of 25 slices shown]
[im 1/25]
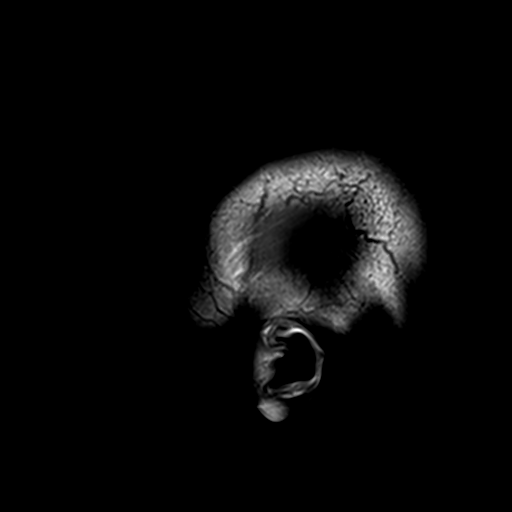
[im 13/25]
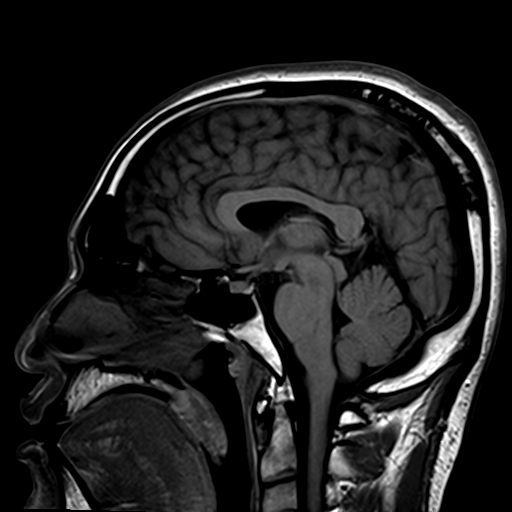
[im 25/25]
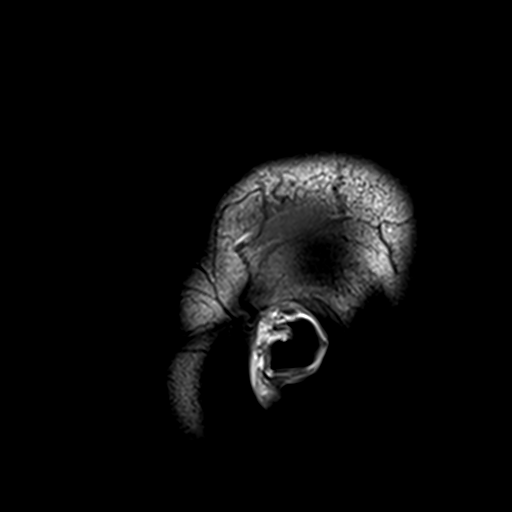

[Series 3: DWI · axial · 3.0mm · 1.80mm/px · z∈[-61,+86]mm · 8 of 100 slices shown (1 of 4)]
[im 1/100]
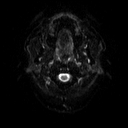
[im 15/100]
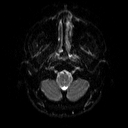
[im 29/100]
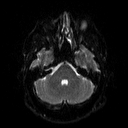
[im 43/100]
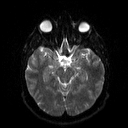
[im 57/100]
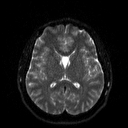
[im 71/100]
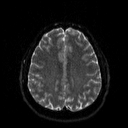
[im 85/100]
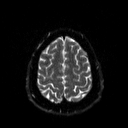
[im 100/100]
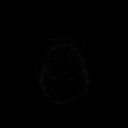

[Series 4: DWI · axial · 3.0mm · 1.80mm/px · z∈[-61,+86]mm · 4 of 50 slices shown (2 of 4)]
[im 1/50]
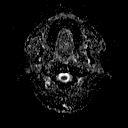
[im 17/50]
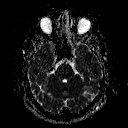
[im 33/50]
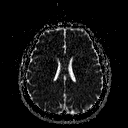
[im 50/50]
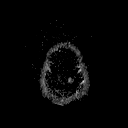

[Series 6: swi_images · axial · 2.0mm · 0.90mm/px · z∈[-67,+90]mm · 7 of 80 slices shown]
[im 1/80]
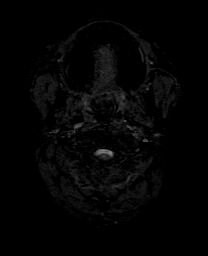
[im 14/80]
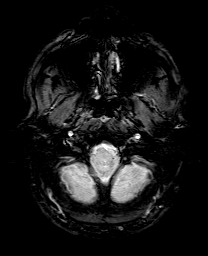
[im 27/80]
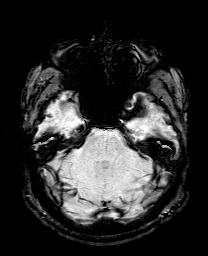
[im 40/80]
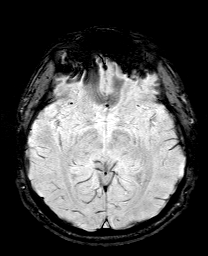
[im 53/80]
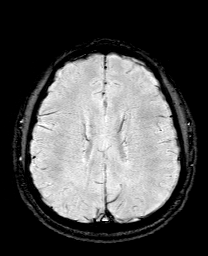
[im 66/80]
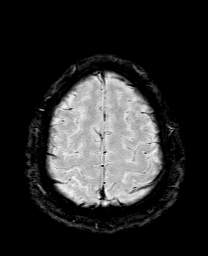
[im 80/80]
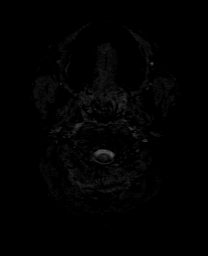

[Series 7: DWI · coronal · 5.0mm · 1.80mm/px · 6 of 70 slices shown (3 of 4)]
[im 1/70]
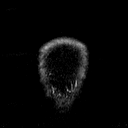
[im 14/70]
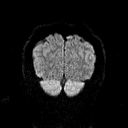
[im 28/70]
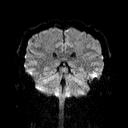
[im 42/70]
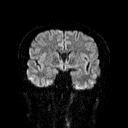
[im 56/70]
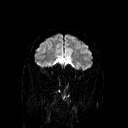
[im 70/70]
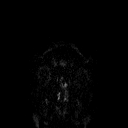

[Series 8: DWI · coronal · 5.0mm · 1.80mm/px · 3 of 35 slices shown (4 of 4)]
[im 1/35]
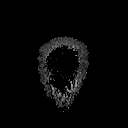
[im 18/35]
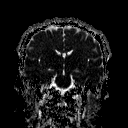
[im 35/35]
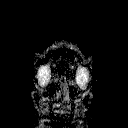

[Series 9: T2 · axial · 5.0mm · 0.51mm/px · z∈[-59,+82]mm · 2 of 22 slices shown (1 of 2)]
[im 1/22]
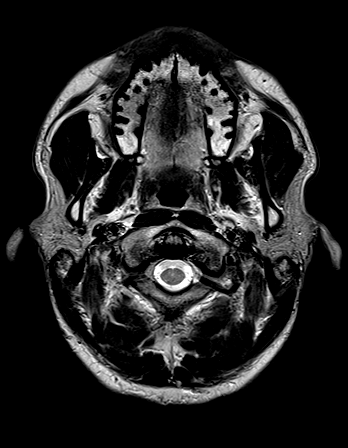
[im 22/22]
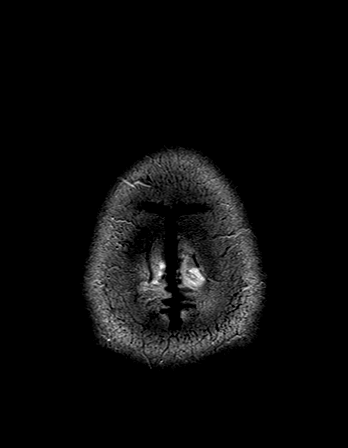

[Series 10: t1_mpr_tra copy center · axial · 1.0mm · 0.45mm/px · z∈[-68,+91]mm · 13 of 160 slices shown]
[im 1/160]
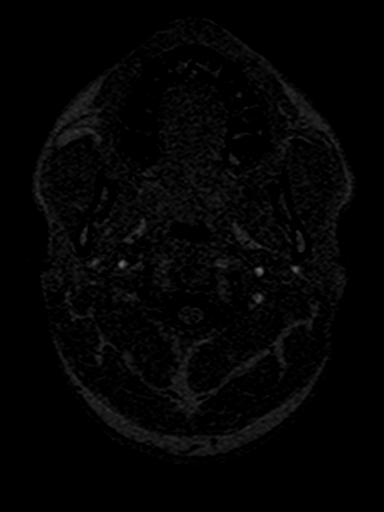
[im 14/160]
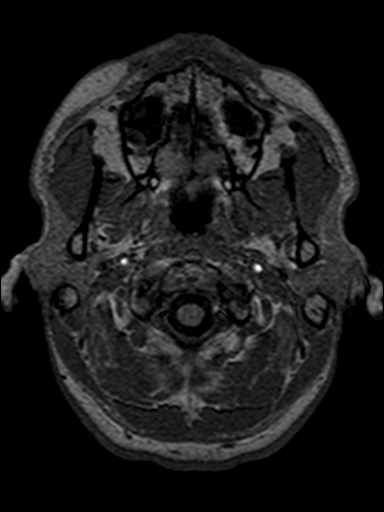
[im 27/160]
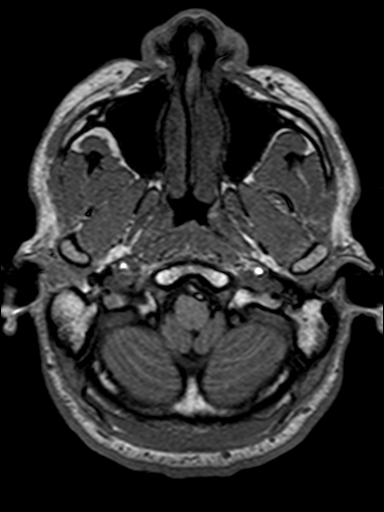
[im 40/160]
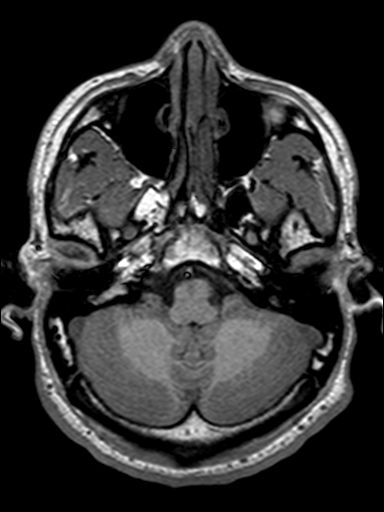
[im 54/160]
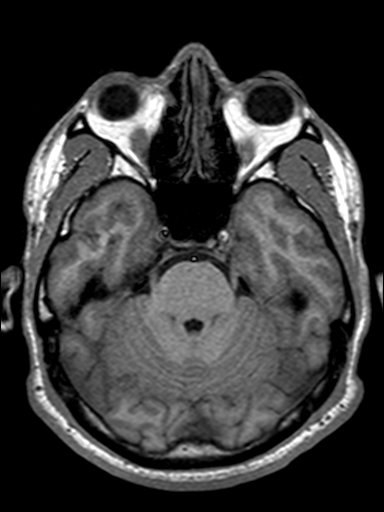
[im 67/160]
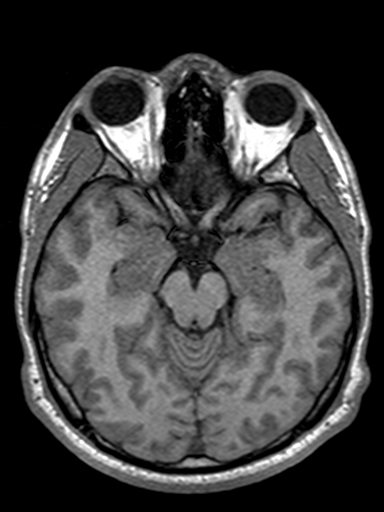
[im 80/160]
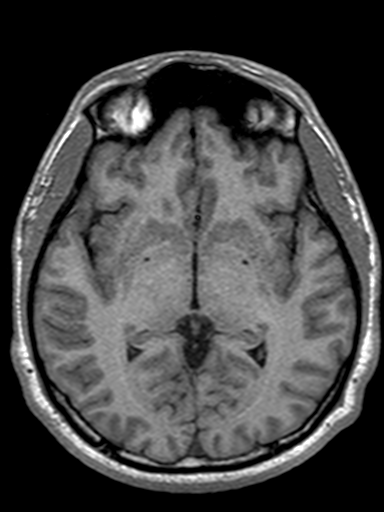
[im 93/160]
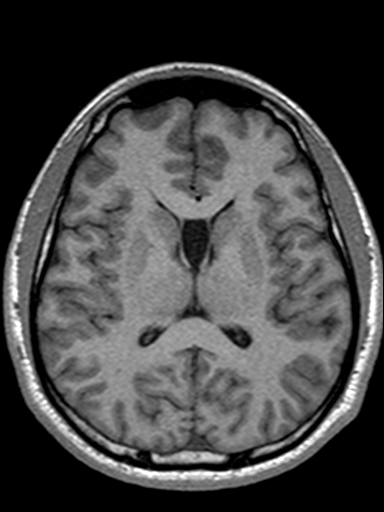
[im 107/160]
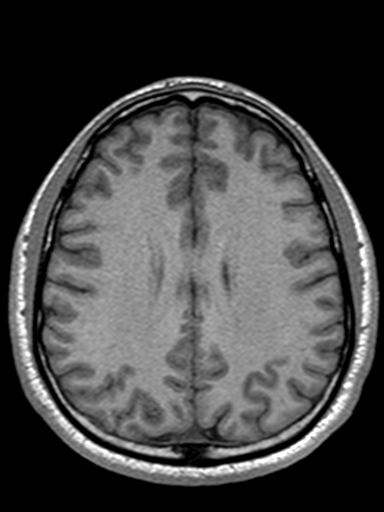
[im 120/160]
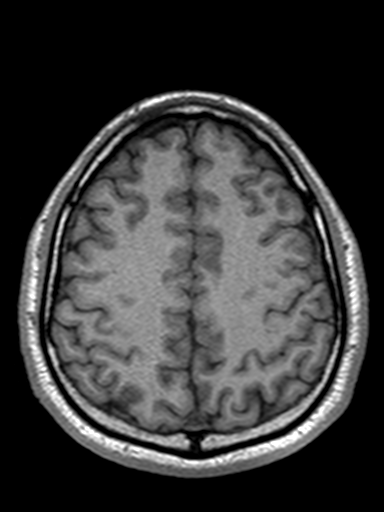
[im 133/160]
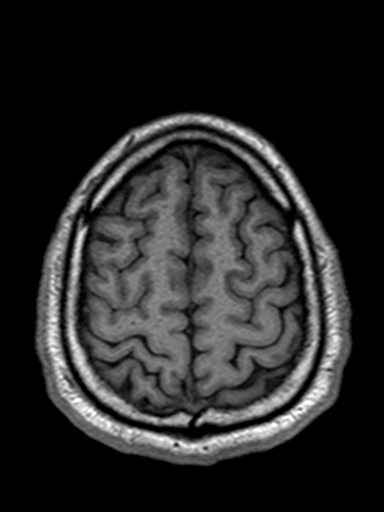
[im 146/160]
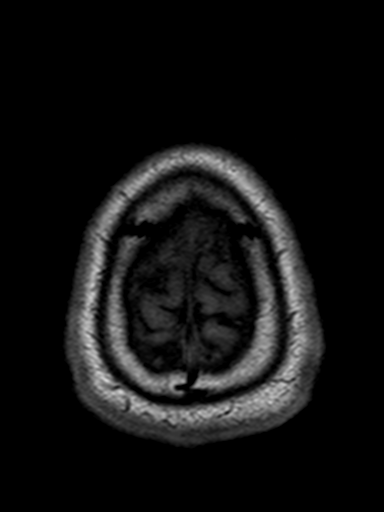
[im 160/160]
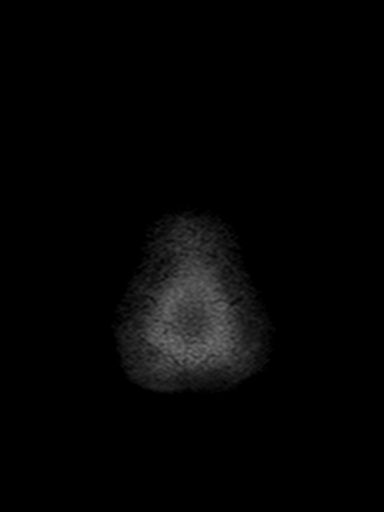

[Series 11: T2 · coronal · 5.0mm · 0.45mm/px · 2 of 29 slices shown (2 of 2)]
[im 1/29]
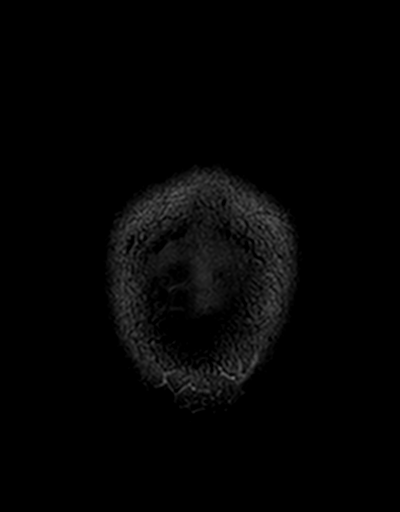
[im 29/29]
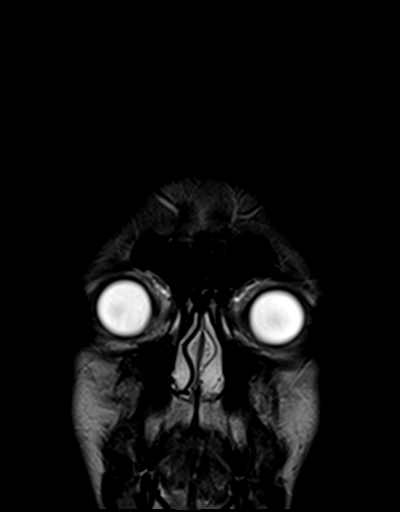

[48 of 48 positions shown; findings below may reference images not displayed]

FINDINGS: Brain: No white matter disease, infarction, hemorrhage,
hydrocephalus, extra-axial collection or mass lesion.

Vascular: Normal flow voids.

Skull and upper cervical spine: Normal marrow signal.

Sinuses/Orbits: Negative.
IMPRESSION: Normal brain MRI.

## 2020-02-15 DIAGNOSIS — Z0000 Encounter for general adult medical examination without abnormal findings: Secondary | ICD-10-CM | POA: Diagnosis not present

## 2020-02-15 DIAGNOSIS — Z113 Encounter for screening for infections with a predominantly sexual mode of transmission: Secondary | ICD-10-CM | POA: Diagnosis not present

## 2020-02-16 LAB — CHLAMYDIA/GONOCOCCUS/TRICHOMONAS, NAA
Chlamydia by NAA: NEGATIVE
Gonococcus by NAA: NEGATIVE
Trich vag by NAA: NEGATIVE

## 2020-02-17 LAB — HSV(HERPES SIMPLEX VRS) I + II AB-IGG
HSV 1 Glycoprotein G Ab, IgG: 1.1 index — ABNORMAL HIGH (ref 0.00–0.90)
HSV 2 IgG, Type Spec: <0.91 index (ref 0.00–0.90)

## 2020-02-17 LAB — CMP14+EGFR
ALT: 27 IU/L (ref 0–44)
AST: 32 IU/L (ref 0–40)
Albumin/Globulin Ratio: 2 (ref 1.2–2.2)
Albumin: 4.7 g/dL (ref 4.1–5.2)
Alkaline Phosphatase: 49 IU/L (ref 44–121)
BUN/Creatinine Ratio: 20 (ref 9–20)
BUN: 20 mg/dL (ref 6–20)
Bilirubin Total: 0.5 mg/dL (ref 0.0–1.2)
CO2: 26 mmol/L (ref 20–29)
Calcium: 9.8 mg/dL (ref 8.7–10.2)
Chloride: 102 mmol/L (ref 96–106)
Creatinine, Ser: 1.02 mg/dL (ref 0.76–1.27)
GFR calc Af Amer: 115 mL/min/{1.73_m2} (ref >59)
GFR calc non Af Amer: 100 mL/min/{1.73_m2} (ref >59)
Globulin, Total: 2.3 g/dL (ref 1.5–4.5)
Glucose: 96 mg/dL (ref 65–99)
Potassium: 4.3 mmol/L (ref 3.5–5.2)
Sodium: 139 mmol/L (ref 134–144)
Total Protein: 7 g/dL (ref 6.0–8.5)

## 2020-02-17 LAB — CBC
Hematocrit: 44.8 % (ref 37.5–51.0)
Hemoglobin: 14.8 g/dL (ref 13.0–17.7)
MCH: 28 pg (ref 26.6–33.0)
MCHC: 33 g/dL (ref 31.5–35.7)
MCV: 85 fL (ref 79–97)
Platelets: 199 10*3/uL (ref 150–450)
RBC: 5.28 x10E6/uL (ref 4.14–5.80)
RDW: 13.9 % (ref 11.6–15.4)
WBC: 6.8 10*3/uL (ref 3.4–10.8)

## 2020-02-17 LAB — HEPATITIS C ANTIBODY: Hep C Virus Ab: <0.1 s/co ratio (ref 0.0–0.9)

## 2020-02-17 LAB — LIPID PANEL
Chol/HDL Ratio: 4 ratio (ref 0.0–5.0)
Cholesterol, Total: 183 mg/dL (ref 100–199)
HDL: 46 mg/dL (ref >39)
LDL Chol Calc (NIH): 109 mg/dL — ABNORMAL HIGH (ref 0–99)
Triglycerides: 158 mg/dL — ABNORMAL HIGH (ref 0–149)
VLDL Cholesterol Cal: 28 mg/dL (ref 5–40)

## 2020-02-17 LAB — RPR: RPR Ser Ql: NONREACTIVE

## 2020-02-17 LAB — HIV ANTIBODY (ROUTINE TESTING W REFLEX): HIV Screen 4th Generation wRfx: NONREACTIVE

## 2020-02-17 LAB — HSV(HERPES SIMPLEX VRS) I + II AB-IGM: HSVI/II Comb IgM: <0.91 Ratio (ref 0.00–0.90)

## 2020-03-07 ENCOUNTER — Other Ambulatory Visit: Payer: Self-pay

## 2020-03-07 ENCOUNTER — Ambulatory Visit (HOSPITAL_COMMUNITY)
Admission: EM | Admit: 2020-03-07 | Discharge: 2020-03-08 | Disposition: A | Discharge status: Home / Self Care | Payer: No Payment, Other | Attending: Psychiatry | Admitting: Psychiatry

## 2020-03-07 DIAGNOSIS — Z20822 Contact with and (suspected) exposure to covid-19: Secondary | ICD-10-CM | POA: Insufficient documentation

## 2020-03-07 DIAGNOSIS — F333 Major depressive disorder, recurrent, severe with psychotic symptoms: Secondary | ICD-10-CM | POA: Diagnosis not present

## 2020-03-07 DIAGNOSIS — F1290 Cannabis use, unspecified, uncomplicated: Secondary | ICD-10-CM | POA: Diagnosis not present

## 2020-03-07 DIAGNOSIS — F419 Anxiety disorder, unspecified: Secondary | ICD-10-CM | POA: Insufficient documentation

## 2020-03-07 DIAGNOSIS — R450 Nervousness: Secondary | ICD-10-CM | POA: Insufficient documentation

## 2020-03-07 DIAGNOSIS — G4700 Insomnia, unspecified: Secondary | ICD-10-CM | POA: Insufficient documentation

## 2020-03-07 DIAGNOSIS — Z7289 Other problems related to lifestyle: Secondary | ICD-10-CM | POA: Insufficient documentation

## 2020-03-07 DIAGNOSIS — Z79899 Other long term (current) drug therapy: Secondary | ICD-10-CM | POA: Insufficient documentation

## 2020-03-07 LAB — COMPREHENSIVE METABOLIC PANEL
ALT: 29 U/L (ref 0–44)
AST: 41 U/L (ref 15–41)
Albumin: 5 g/dL (ref 3.5–5.0)
Alkaline Phosphatase: 46 U/L (ref 38–126)
Anion gap: 16 — ABNORMAL HIGH (ref 5–15)
BUN: 21 mg/dL — ABNORMAL HIGH (ref 6–20)
CO2: 21 mmol/L — ABNORMAL LOW (ref 22–32)
Calcium: 10.2 mg/dL (ref 8.9–10.3)
Chloride: 103 mmol/L (ref 98–111)
Creatinine, Ser: 1.23 mg/dL (ref 0.61–1.24)
GFR, Estimated: >60 mL/min (ref >60)
Glucose, Bld: 88 mg/dL (ref 70–99)
Potassium: 3.9 mmol/L (ref 3.5–5.1)
Sodium: 140 mmol/L (ref 135–145)
Total Bilirubin: 2.4 mg/dL — ABNORMAL HIGH (ref 0.3–1.2)
Total Protein: 8.2 g/dL — ABNORMAL HIGH (ref 6.5–8.1)

## 2020-03-07 LAB — POC SARS CORONAVIRUS 2 AG: SARS Coronavirus 2 Ag: NEGATIVE

## 2020-03-07 LAB — CBC WITH DIFFERENTIAL/PLATELET
Abs Immature Granulocytes: 0.01 10*3/uL (ref 0.00–0.07)
Basophils Absolute: 0.1 10*3/uL (ref 0.0–0.1)
Basophils Relative: 1 %
Eosinophils Absolute: 0 10*3/uL (ref 0.0–0.5)
Eosinophils Relative: 0 %
HCT: 49 % (ref 39.0–52.0)
Hemoglobin: 16.5 g/dL (ref 13.0–17.0)
Immature Granulocytes: 0 %
Lymphocytes Relative: 18 %
Lymphs Abs: 1.4 10*3/uL (ref 0.7–4.0)
MCH: 29.8 pg (ref 26.0–34.0)
MCHC: 33.7 g/dL (ref 30.0–36.0)
MCV: 88.4 fL (ref 80.0–100.0)
Monocytes Absolute: 0.7 10*3/uL (ref 0.1–1.0)
Monocytes Relative: 8 %
Neutro Abs: 6 10*3/uL (ref 1.7–7.7)
Neutrophils Relative %: 73 %
Platelets: 218 10*3/uL (ref 150–400)
RBC: 5.54 MIL/uL (ref 4.22–5.81)
RDW: 14 % (ref 11.5–15.5)
WBC: 8.1 10*3/uL (ref 4.0–10.5)
nRBC: 0 % (ref 0.0–0.2)

## 2020-03-07 LAB — LIPID PANEL
Cholesterol: 167 mg/dL (ref 0–200)
HDL: 56 mg/dL (ref >40)
LDL Cholesterol: 102 mg/dL — ABNORMAL HIGH (ref 0–99)
Total CHOL/HDL Ratio: 3 RATIO
Triglycerides: 43 mg/dL (ref <150)
VLDL: 9 mg/dL (ref 0–40)

## 2020-03-07 LAB — RESP PANEL BY RT-PCR (FLU A&B, COVID) ARPGX2
Influenza A by PCR: NEGATIVE
Influenza B by PCR: NEGATIVE
SARS Coronavirus 2 by RT PCR: NEGATIVE

## 2020-03-07 LAB — POCT URINE DRUG SCREEN - MANUAL ENTRY (I-SCREEN)
POC Amphetamine UR: NOT DETECTED
POC Buprenorphine (BUP): NOT DETECTED
POC Cocaine UR: NOT DETECTED
POC Marijuana UR: POSITIVE — AB
POC Methadone UR: NOT DETECTED
POC Methamphetamine UR: NOT DETECTED
POC Morphine: NOT DETECTED
POC Oxazepam (BZO): NOT DETECTED
POC Oxycodone UR: NOT DETECTED
POC Secobarbital (BAR): NOT DETECTED

## 2020-03-07 LAB — ETHANOL: Alcohol, Ethyl (B): <10 mg/dL (ref <10)

## 2020-03-07 LAB — TSH: TSH: 0.567 u[IU]/mL (ref 0.350–4.500)

## 2020-03-07 LAB — HEMOGLOBIN A1C
Hgb A1c MFr Bld: 5.7 % — ABNORMAL HIGH (ref 4.8–5.6)
Mean Plasma Glucose: 116.89 mg/dL

## 2020-03-07 MED ORDER — ALUM & MAG HYDROXIDE-SIMETH 200-200-20 MG/5ML PO SUSP: 30.0000 mL | ORAL | Status: DC | PRN

## 2020-03-07 MED ORDER — MAGNESIUM HYDROXIDE 400 MG/5ML PO SUSP: 30.0000 mL | Freq: Every day | ORAL | Status: DC | PRN

## 2020-03-07 MED ORDER — OLANZAPINE 5 MG PO TABS
5.0000 mg | ORAL_TABLET | Freq: Every day | ORAL | Status: DC
  Administered 2020-03-07: 5 mg via ORAL
  Filled 2020-03-07: qty 7
  Filled 2020-03-07: qty 1

## 2020-03-07 MED ORDER — SERTRALINE HCL 100 MG PO TABS
100.0000 mg | ORAL_TABLET | Freq: Every day | ORAL | Status: DC
  Administered 2020-03-08: 100 mg via ORAL
  Filled 2020-03-07: qty 1
  Filled 2020-03-07: qty 7

## 2020-03-07 MED ORDER — ACETAMINOPHEN 325 MG PO TABS: 650.0000 mg | ORAL_TABLET | Freq: Four times a day (QID) | ORAL | Status: DC | PRN

## 2020-03-07 MED ORDER — OLANZAPINE 5 MG PO TABS
5.0000 mg | ORAL_TABLET | Freq: Once | ORAL | Status: AC
  Administered 2020-03-07: 5 mg via ORAL
  Filled 2020-03-07: qty 1

## 2020-03-07 MED ORDER — HYDROXYZINE HCL 25 MG PO TABS
25.0000 mg | ORAL_TABLET | Freq: Three times a day (TID) | ORAL | Status: DC | PRN
  Administered 2020-03-07: 25 mg via ORAL
  Filled 2020-03-07: qty 1

## 2020-03-07 MED ORDER — TRAZODONE HCL 50 MG PO TABS
50.0000 mg | ORAL_TABLET | Freq: Every evening | ORAL | Status: DC | PRN
  Administered 2020-03-07: 50 mg via ORAL
  Filled 2020-03-07: qty 7
  Filled 2020-03-07: qty 1

## 2020-03-07 NOTE — ED Provider Notes (Signed)
Behavioral Health Admission [facility]&P [facility] & OBS)  Date: 03/07/20 Patient Name: John Holmes MRN: [id number] Chief Complaint: No chief complaint on file.     Diagnoses:  Final diagnoses:  Severe episode of recurrent major depressive disorder, with psychotic features (HCC)    HPI: Patient is a 30 year old male that presented to the [facility] voluntarily with his mother.  Patient presents with limited interaction and unwilling to discuss the events that are been happening but appears to be very depressed, anxious, and disorganized.  It was reported that the patient has been at home and has been diagnosed with MDD and is was started on Zoloft 100 mg p.o. daily and has been on that for several weeks.  Recently the patient has presented paranoid and having auditory and visual hallucinations.  Patient has been stating that he is hearing voices that are trying to harm his family and also seeing people outside of the windows that are not there that he feels are trying to harm the family.  It is also reported that the patient has had poor sleep of possibly 4 hours of sleep per night.  Patient's mother reports that he was also trying to run to the car last night to get his pistol out so that he could protect the family.  Patient's mother feels that he is in extreme distress and she is considerably worried about him.  There is been no mention of the patient being suicidal or homicidal, however with the patient's current presentation with auditory and visual hallucinations and having concerns that he is protecting his family patient is a threat to others as well as himself.  Patient will be admitted to the continuous observation unit for overnight assessment and continued on his Zoloft 100 mg p.o. daily and started on Zyprexa 5 mg p.o. nightly.  Patient will be reevaluated tomorrow.  PHQ 2-9:  [place] Visit from 12/29/2019 in [place] Office Visit from 04/09/2019 in [place] Office Visit from 02/05/2017 in [place]  Thoughts that you would be better off dead, or of hurting yourself in some way Not at all Not at all Not at all  PHQ-9 Total Score 0 7 --        Total Time spent with patient: 45 minutes  Musculoskeletal  Strength & Muscle Tone: within normal limits Gait & Station: normal Patient leans: N/A  Psychiatric Specialty Exam  Presentation General Appearance: Appropriate for Environment; Casual; Fairly Groomed  Eye Contact:Minimal  Speech:Normal Rate  Speech Volume:Decreased  Handedness:Right   Mood and Affect  Mood:Anxious; Dysphoric  Affect:Depressed; Tearful; Congruent   Thought Process  Thought Processes:Disorganized  Descriptions of Associations:Circumstantial  Orientation:Full (Time, Place and Person)  Thought Content:Paranoid Ideation  Hallucinations:Hallucinations: Auditory; Visual Description of Auditory Hallucinations: voices telling that they are going to hurt his family Description of Visual Hallucinations: Sees people outside that are not there, feels they are coming after the family  Ideas of Reference:Paranoia  Suicidal Thoughts:Suicidal Thoughts: No  Homicidal Thoughts:Homicidal Thoughts: No   Sensorium  Memory:Immediate Poor; Remote Poor; Recent Poor  Judgment:Impaired  Insight:Lacking   Executive Functions  Concentration:Poor  Attention Span:Poor  Recall:Poor  Fund of Knowledge:Poor  Language:Fair   Psychomotor Activity  Psychomotor Activity:Psychomotor Activity: Normal   Assets  Assets:Desire for Improvement; Housing; Physical Health; Social Support; Transportation   Sleep  Sleep:Sleep: Poor   Physical Exam Vitals and nursing note reviewed.  Constitutional:      Appearance: He is well-developed.  HENT:     Head: Normocephalic.  Eyes:     Pupils: Pupils are equal, round, and reactive to light.  Cardiovascular:     Rate and Rhythm: Normal rate.   Pulmonary:     Effort: Pulmonary effort is normal.  Musculoskeletal:        General: Normal range of motion.  Neurological:     Mental Status: He is alert and oriented to person, place, and time.    Review of Systems  Constitutional: Negative.   HENT: Negative.   Eyes: Negative.   Respiratory: Negative.   Cardiovascular: Negative.   Gastrointestinal: Negative.   Genitourinary: Negative.   Musculoskeletal: Negative.   Skin: Negative.   Neurological: Negative.   Endo/Heme/Allergies: Negative.   Psychiatric/Behavioral: Positive for depression, hallucinations and substance abuse. The patient is nervous/anxious and has insomnia.     Blood pressure (!) 158/97, pulse 90, temperature 97.7 F (36.5 C), temperature source Temporal, resp. rate 18, height 6' 2"  (1.88 m), weight 222 lb (100.7 kg), SpO2 100 %. Body mass index is 28.5 kg/m.  Past Psychiatric History: MDD, GAD   Is the patient at risk to self? Yes  Has the patient been a risk to self in the past 6 months? Yes .    Has the patient been a risk to self within the distant past? No   Is the patient a risk to others? Yes   Has the patient been a risk to others in the past 6 months? No   Has the patient been a risk to others within the distant past? No   Past Medical History:  Past Medical History:  Diagnosis Date  . Anxiety   . Depression     Past Surgical History:  Procedure Laterality Date  . KNEE ARTHROSCOPY W/ MENISCAL REPAIR Right     Family History:  Family History  Problem Relation Age of Onset  . Healthy Mother   . COPD Father        lung transplant  . Healthy Sister   . Healthy Brother   . Diabetes Paternal Grandmother     Social History:  Social History   Socioeconomic History  . Marital status: Single    Spouse name: Not on file  . Number of children: 0  . Years of education: Not on file  . Highest education level: Bachelor's degree (e.g., BA, AB, BS)  Occupational History  . Occupation:  Occupational psychologist    Comment: [employer]  Tobacco Use  . Smoking status: Never Smoker  . Smokeless tobacco: Never Used  Vaping Use  . Vaping Use: Never used  Substance and Sexual Activity  . Alcohol use: Yes    Alcohol/week: 0.0 standard drinks    Comment: drinks 2 bottles (12 oz) occasionally- socially  . Drug use: No  . Sexual activity: Yes    Partners: Female    Birth control/protection: Condom  Other Topics Concern  . Not on file  Social History Narrative  . Not on file   Social Determinants of Health   Financial Resource Strain: Not on file  Food Insecurity: Not on file  Transportation Needs: Not on file  Physical Activity: Not on file  Stress: Not on file  Social Connections: Not on file  Intimate Partner Violence: Not on file    SDOH:  SDOH Screenings   Alcohol Screen: Not on file  Depression (PHQ2-9): Low Risk   . PHQ-2 Score: 0  Financial Resource Strain: Not on file  Food  Insecurity: Not on file  Housing: Not on file  Physical Activity: Not on file  Social Connections: Not on file  Stress: Not on file  Tobacco Use: Low Risk   . Smoking Tobacco Use: Never Smoker  . Smokeless Tobacco Use: Never Used  Transportation Needs: Not on file    Last Labs:  Office Visit on 12/29/2019  Component Date Value Ref Range Status  . WBC 02/15/2020 6.8  3.4 - 10.8 x10E3/uL Final  . RBC 02/15/2020 5.28  4.14 - 5.80 x10E6/uL Final  . Hemoglobin 02/15/2020 14.8  13.0 - 17.7 g/dL Final  . Hematocrit 02/15/2020 44.8  37.5 - 51.0 % Final  . MCV 02/15/2020 85  79 - 97 fL Final  . MCH 02/15/2020 28.0  26.6 - 33.0 pg Final  . MCHC 02/15/2020 33.0  31.5 - 35.7 g/dL Final  . RDW 02/15/2020 13.9  11.6 - 15.4 % Final  . Platelets 02/15/2020 199  150 - 450 x10E3/uL Final  . Glucose 02/15/2020 96  65 - 99 mg/dL Final  . BUN 02/15/2020 20  6 - 20 mg/dL Final  . Creatinine, Ser 02/15/2020 1.02  0.76 - 1.27 mg/dL Final  . GFR calc non Af Amer 02/15/2020 100  >59 mL/min/1.73 Final  . GFR  calc Af Amer 02/15/2020 115  >59 mL/min/1.73 Final   Comment: **In accordance with recommendations from the NKF-ASN Task force,**   [facility] is in the process of updating its eGFR calculation to the   2021 CKD-EPI creatinine equation that estimates kidney function   without a race variable.   . BUN/Creatinine Ratio 02/15/2020 20  9 - 20 Final  . Sodium 02/15/2020 139  134 - 144 mmol/L Final  . Potassium 02/15/2020 4.3  3.5 - 5.2 mmol/L Final  . Chloride 02/15/2020 102  96 - 106 mmol/L Final  . CO2 02/15/2020 26  20 - 29 mmol/L Final  . Calcium 02/15/2020 9.8  8.7 - 10.2 mg/dL Final  . Total Protein 02/15/2020 7.0  6.0 - 8.5 g/dL Final  . Albumin 02/15/2020 4.7  4.1 - 5.2 g/dL Final  . Globulin, Total 02/15/2020 2.3  1.5 - 4.5 g/dL Final  . Albumin/Globulin Ratio 02/15/2020 2.0  1.2 - 2.2 Final  . Bilirubin Total 02/15/2020 0.5  0.0 - 1.2 mg/dL Final  . Alkaline Phosphatase 02/15/2020 49  44 - 121 IU/L Final  . AST 02/15/2020 32  0 - 40 IU/L Final  . ALT 02/15/2020 27  0 - 44 IU/L Final  . Cholesterol, Total 02/15/2020 183  100 - 199 mg/dL Final  . Triglycerides 02/15/2020 158* 0 - 149 mg/dL Final  . HDL 02/15/2020 46  >39 mg/dL Final  . VLDL Cholesterol Cal 02/15/2020 28  5 - 40 mg/dL Final  . LDL Chol Calc (NIH) 02/15/2020 109* 0 - 99 mg/dL Final  . Chol/HDL Ratio 02/15/2020 4.0  0.0 - 5.0 ratio Final   Comment:                                   T. Chol/HDL Ratio                                             Men  Women  1/2 Avg.Risk  3.4    3.3                                   Avg.Risk  5.0    4.4                                2X Avg.Risk  9.6    7.1                                3X Avg.Risk 23.4   11.0   . HSV 1 Glycoprotein G Ab, IgG 02/15/2020 1.10* 0.00 - 0.90 index Final   Comment:                                  Negative        <0.91                                  Equivocal 0.91 - 1.09                                  Positive        >1.09   Note: Negative indicates no antibodies detected to  HSV-1. Equivocal may suggest early infection.  If  clinically appropriate, retest at later date. Positive  indicates antibodies detected to HSV-1.   . HSV 2 IgG, Type Spec 02/15/2020 <0.91  0.00 - 0.90 index Final   Comment:                                  Negative        <0.91                                  Equivocal 0.91 - 1.09                                  Positive        >1.09  Note: Negative indicates no HSV-2 antibodies detected.  Positive indicates HSV-2 antibodies detected.  Equivocal and low positive HSV-2 screens  (Index 0.91-5.00) may be false positive and are  reflexed to supplemental testing in accordance with  CDC guidelines.   Marland Kitchen HSVI/II Comb IgM 02/15/2020 <0.91  0.00 - 0.90 Ratio Final   Comment:                                  Negative        <0.91                                  Equivocal 0.91 - 1.09  Positive        >1.09   . HIV Screen 4th Generation wRfx 02/15/2020 Non Reactive  Non Reactive Final   Comment: HIV Negative HIV-1/HIV-2 antibodies and HIV-1 p24 antigen were NOT detected. There is no laboratory evidence of HIV infection.   . Hep C Virus Ab 02/15/2020 <0.1  0.0 - 0.9 s/co ratio Final   Comment:                                   Negative:     < 0.8                              Indeterminate: 0.8 - 0.9                                   Positive:     > 0.9  The CDC recommends that a positive HCV antibody result  be followed up with a HCV Nucleic Acid Amplification  test (702202).   . RPR Ser Ql 02/15/2020 Non Reactive  Non Reactive Final  . Chlamydia by NAA 02/15/2020 Negative  Negative Final  . Gonococcus by NAA 02/15/2020 Negative  Negative Final  . Trich vag by NAA 02/15/2020 Negative  Negative Final    Allergies: Patient has no known allergies.  PTA Medications: (Not in a hospital admission)   Medical Decision Making  Covid and labs  ordered Continue Zoloft 100 mg PO Daily Start Zyprexa 5 mg PO QHS and a one time dose on admission to obs unit    Recommendations  Based on my evaluation the patient does not appear to have an emergency medical condition.  [place], FNP 03/07/20  2:18 PM

## 2020-03-07 NOTE — BH Assessment (Signed)
Patient presents this date voluntary with ongoing anxiety. Patient reports 4 hours or less nightly for the last week. patient is currently prescribed Zoloft 100 mg for symptom management. Patient cannot recall who his provider is. Patient denies any S/I, H/I or AVH. Patient is URGENT

## 2020-03-07 NOTE — Discharge Instructions (Addendum)

## 2020-03-07 NOTE — ED Notes (Signed)
Pt asleep in bed. Respirations even and unlabored. Will continue to monitor for safety. ?

## 2020-03-07 NOTE — Progress Notes (Signed)
Pt admitted to OBS bed 4 due worsening depression and anxiety. Pt is electively mute and responds to questions by shaking his head. Pt  Ambulated to the unit and was oriented to unit and staff. No acute distress noted. Pt took his medication without incident. Pt denies pain, SI, HI and AVH at this time. Staff will monitor for pt's safety.

## 2020-03-07 NOTE — BH Assessment (Signed)
Comprehensive Clinical Assessment (CCA) Note  03/07/2020 John Holmes [id number]  John Holmes is a 30 year old male presenting to [facility] with his mother John Holmes voluntarily with chief complaint of worsening depression and anxiety. Patient reports difficulty expressing what he is experiencing and states, "I can't explain it" and reports having anxiety his whole life. Patient eye contact is poor, his head is held down, his speech is low in tone and patient appears depressed. Patient mother interjects and reports that patient has been staying at her house since Friday. Mom reports that patient has been very emotional, crying and sleeping less than 2 hours a night. Mom reports that last night and today patient was complaining of people being outside talking about trying to harm him and his family. Mom reports that patient ran into the kitchen and said, "this is too real, did you hear the people outside talking trying to hurt [place]". Mom reports that patient father has a history of mental illness but is not sure of diagnosis. Patient reports experiencing hallucinations before but "not as transparent". Patient reports that his PCP is prescribing him Zoloft for his depression but there are some concerns about the mg patient is supposed to be taken. Mom thinks that patient is supposed to take 50mg  and reports that patient took 100mg  at 4am and she was not totally sure he was supposed to because he has been taken half the dosage since Friday. Patient does not have a history of inpatient or outpatient mental health services.   Patient currently lives alone but is planning on moving in with his mother due to not working. Patient use to work at as a Thursday. Mom reports that patient stopped working at [employer] because the job was too stressful. Mom also reports that patient broke up with his partner about three weeks ago. Patient denies having any recent traumas or other stressors. Patient reports smoking  marijuana last Thursday and denies using any other drugs or alcohol. Patients seem to be experiencing visual/auditory hallucinations and paranoia. Patient acknowledges depressive symptoms of isolation, poor sleep, anhedonia, feeling helpless and hopeless and crying.  Patient denies SI, HI and SIB.  Disposition: Per [employer], NP, patient recommended for observation.   Chief Complaint:  Chief Complaint  Patient presents with  . Anxiety  . Depression  . Hallucinations   Visit Diagnosis:Severe episode of recurrent major depressive disorder, with psychotic features (HCC)   CCA Screening, Triage and Referral (STR)  Patient Reported Information How did you hear about Tuesday? No data recorded Referral name: No data recorded Referral phone number: No data recorded  Whom do you see for routine medical problems? No data recorded Practice/Facility Name: No data recorded Practice/Facility Phone Number: No data recorded Name of Contact: No data recorded Contact Number: No data recorded Contact Fax Number: No data recorded Prescriber Name: No data recorded Prescriber Address (if known): No data recorded  What Is the Reason for Your Visit/Call Today? No data recorded How Long Has This Been Causing You Problems? No data recorded What Do You Feel Would Help You the Most Today? No data recorded  Have You Recently Been in Any Inpatient Treatment (Hospital/Detox/Crisis Center/28-Day Program)? No data recorded Name/Location of Program/Hospital:No data recorded How Long Were You There? No data recorded When Were You Discharged? No data recorded  Have You Ever Received Services From [facility] Before? No data recorded Who Do You See at [facility]? No data recorded  Have You Recently Had Any Thoughts About Hurting  Yourself? No data recorded Are You Planning to Commit Suicide/Harm Yourself At This time? No data recorded  Have you Recently Had Thoughts About Hurting Someone Karolee Ohslse? No data  recorded Explanation: No data recorded  Have You Used Any Alcohol or Drugs in the Past 24 Hours? No data recorded How Long Ago Did You Use Drugs or Alcohol? No data recorded What Did You Use and How Much? No data recorded  Do You Currently Have a Therapist/Psychiatrist? No data recorded Name of Therapist/Psychiatrist: No data recorded  Have You Been Recently Discharged From Any Office Practice or Programs? No data recorded Explanation of Discharge From Practice/Program: No data recorded    CCA Screening Triage Referral Assessment Type of Contact: No data recorded Is this Initial or Reassessment? No data recorded Date Telepsych consult ordered in [facility]:  No data recorded Time Telepsych consult ordered in [facility]:  No data recorded  Patient Reported Information Reviewed? No data recorded Patient Left Without Being Seen? No data recorded Reason for Not Completing Assessment: No data recorded  Collateral Involvement: No data recorded  Does Patient Have a Court Appointed Legal Guardian? No data recorded Name and Contact of Legal Guardian: No data recorded If Minor and Not Living with Parent(s), Who has Custody? No data recorded Is CPS involved or ever been involved? No data recorded Is APS involved or ever been involved? No data recorded  Patient Determined To Be At Risk for Harm To Self or Others Based on Review of Patient Reported Information or Presenting Complaint? No data recorded Method: No data recorded Availability of Means: No data recorded Intent: No data recorded Notification Required: No data recorded Additional Information for Danger to Others Potential: No data recorded Additional Comments for Danger to Others Potential: No data recorded Are There Guns or Other Weapons in Your Home? No data recorded Types of Guns/Weapons: No data recorded Are These Weapons Safely Secured?                            No data recorded Who Could Verify You Are Able To Have These Secured: No  data recorded Do You Have any Outstanding Charges, Pending Court Dates, Parole/Probation? No data recorded Contacted To Inform of Risk of Harm To Self or Others: No data recorded  Location of Assessment: No data recorded  Does Patient Present under Involuntary Commitment? No data recorded IVC Papers Initial File Date: No data recorded  [place] data recorded  Patient Currently Receiving the Following Services: No data recorded  Determination of Need: No data recorded  Options For Referral: No data recorded    CCA Biopsychosocial Intake/Chief Complaint:  Depression, Anxiety, Hallucinations  Current Symptoms/Problems: paranoia, poor sleep, depression, auditory hallucnations   Patient Reported Schizophrenia/Schizoaffective Diagnosis in Past: No   Strengths: UTA  Preferences: UTA  Abilities: UTA   Type of Services Patient Feels are Needed: No data recorded  Initial Clinical Notes/Concerns: No data recorded  Mental Health Symptoms Depression:  Sleep (too much or little); Tearfulness; Hopelessness; Change in energy/activity   Duration of Depressive symptoms: Greater than two weeks   Mania:  N/A   Anxiety:   Sleep; Worrying   Psychosis:  Hallucinations   Duration of Psychotic symptoms: Less than six months   Trauma:  N/A   Obsessions:  N/A   Compulsions:  N/A   Inattention:  N/A   Hyperactivity/Impulsivity:  N/A   Oppositional/Defiant Behaviors:  N/A   Emotional Irregularity:  N/A   Other Mood/Personality Symptoms:  No data recorded   Mental Status Exam Appearance and self-care  Stature:  Tall   Weight:  Average weight   Clothing:  Age-appropriate   Grooming:  Neglected   Cosmetic use:  None   Posture/gait:  Slumped   Motor activity:  Agitated   Sensorium  Attention:  Distractible   Concentration:  Anxiety interferes   Orientation:  Person; Place   Recall/memory:  Normal   Affect and Mood  Affect:  Tearful   Mood:   Anxious   Relating  Eye contact:  None   Facial expression:  Tense; Anxious   Attitude toward examiner:  Cooperative   Thought and Language  Speech flow: Soft   Thought content:  Appropriate to Mood and Circumstances   Preoccupation:  None   Hallucinations:  Auditory   Organization:  No data recorded  [employer] of Knowledge:  Fair   Intelligence:  Average   Abstraction:  No data recorded  Judgement:  No data recorded  Reality Testing:  No data recorded  Insight:  Lacking   Decision Making:  Normal   Social Functioning  Social Maturity:  Isolates   Social Judgement:  Normal   Stress  Stressors:  Work   Coping Ability:  Human resources officer Deficits:  None   Supports:  Family     Religion:    Leisure/Recreation:    Exercise/Diet: Exercise/Diet Do You Have Any Trouble Sleeping?: Yes Explanation of Sleeping Difficulties: less than 2 hrs for past few days   CCA Employment/Education Employment/Work Situation: Employment / Work Situation Employment situation: Unemployed Patient's job has been impacted by current illness: Yes Describe how patient's job has been impacted: stressful work What is the longest time patient has a held a job?: 6 yrs 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 Has patient ever been in the [employer]?: No  Education: Education Is Patient Currently Attending School?: No   CCA Family/Childhood History Family and Relationship History: Family history What is your sexual orientation?: UTA Has your sexual activity been affected by drugs, alcohol, medication, or emotional stress?: UTA  Childhood History:  Childhood History Additional childhood history information: UTA Description of patient's relationship with caregiver when they were a child: UTA Patient's description of current relationship with people who raised him/her: UTA How were you disciplined when you got in trouble as a  child/adolescent?: UTA Does patient have siblings?: Yes Number of Siblings: 2  Child/Adolescent Assessment:     CCA Substance Use Alcohol/Drug Use: Alcohol / Drug Use Pain Medications: See MAR Prescriptions: See MAR Over the Counter: See MAR History of alcohol / drug use?: Yes Substance #1 Name of Substance 1: Cannabis 1 - Last Use / Amount: 03/03/20 1- Route of Use: smoking                       ASAM's:  Six Dimensions of Multidimensional Assessment  Dimension 1:  Acute Intoxication and/or Withdrawal Potential:      Dimension 2:  Biomedical Conditions and Complications:      Dimension 3:  Emotional, Behavioral, or Cognitive Conditions and Complications:     Dimension 4:  Readiness to Change:     Dimension 5:  Relapse, Continued use, or Continued Problem Potential:     Dimension 6:  Recovery/Living Environment:     ASAM Severity Score:    ASAM Recommended Level of Treatment:     Substance  use Disorder (SUD)    Recommendations for Services/Supports/Treatments: Recommendations for Services/Supports/Treatments Recommendations For Services/Supports/Treatments: Individual Therapy  DSM5 Diagnoses: Patient Active Problem List   Diagnosis Date Noted  . Social phobia involving fear of public speaking 06/23/2015  . Generalized anxiety disorder 06/16/2015  . DERANGEMENT MENISCUS 03/29/2008  . TEAR LATERAL MENISCUS 03/29/2008   Disposition: Per Reola Calkins, NP, patient recommended for observation.   Agastya Meister Shirlee More, [facility]

## 2020-03-08 MED ORDER — OLANZAPINE 5 MG PO TABS: 5.0000 mg | ORAL_TABLET | Freq: Every day | ORAL | 0 refills | Status: AC

## 2020-03-08 MED ORDER — TRAZODONE HCL 50 MG PO TABS: 50.0000 mg | ORAL_TABLET | Freq: Every evening | ORAL | 0 refills | Status: AC | PRN

## 2020-03-08 NOTE — ED Notes (Signed)
Pt offered breakfast;declined. 

## 2020-03-08 NOTE — ED Notes (Signed)
Pt asleep in bed. Respirations even and unlabored. Will continue to monitor for safety. ?

## 2020-03-08 NOTE — ED Notes (Signed)
Patient A&O x 4, ambulatory. Patient discharged in no acute distress. Patient denied SI/HI, A/VH upon discharge. Patient verbalized understanding of all discharge instructions explained by staff, to include follow up appointments, RX's and safety plan. Pt given pharmacy samples of medications along with RX to be filled. Pt belongings returned to patient from locker #27 intact. Patient escorted to lobby via staff for transport to destination via patients' mother. Safety maintained.

## 2020-03-08 NOTE — ED Notes (Signed)
Pt remains asleep in bed. Respirations even and unlabored. Will continue to monitor for safety.

## 2020-03-08 NOTE — ED Provider Notes (Signed)
FBC/OBS ASAP Discharge Summary  Date and Time: 03/08/2020 10:54 AM  Name: John Holmes  MRN:  [id number]   Discharge Diagnoses:  Final diagnoses:  Severe episode of recurrent major depressive disorder, with psychotic features (HCC)    Subjective: States "I feel much better today." Reports sleeping well last night over 8 hours, and states he no longer is having paranoia or auditory/visual hallucinations.  He reports over last two weeks has only slept four hours a night, anhedonia, decreased appetite, no psychomotor agitation/depression, and rare passive SI- last thought yesterday morning without plan or intent "I just kept hearing the voices." Reports marked improvement in his depressive symptoms last night "I feel much better after sleeping."  Denies anxiety.  Patient denies specific triggering stressor, endorses past trauma "a culmination of things" but does not wish to discuss further. He states he smokes marijuana "it used to be daily, but I don't do it that much anymore." He states he drinks rarely "2 drinks" socially. Denies any other substance use.  Patient wishes to return back to his parents house, plans to live with them for a month, then hopefully return to his home in [place].  Patient reports father has locked up the guns, and patient has no access to to them. Patient agrees with treatment plan to return in a week to follow up with walk-in psychiatry at [facility]. It is recommended he do walk-in therapy but he says he feels that he doesn't need therapy.  Patient verbally consents for [place] to speak with his mother John Holmes about his status, treatment, and recommendations and to develop a safety plan.   1045 spoke with patient's mother John Holmes at [phone]. Updated on patient's current status,his improvement in mood and behavior, and that patient now denies AV hallucinations and paranoia. Mother feels comfortable with patient returning home. She  says that the guns are locked up in a safe. She says there is someone at home 24/7.  She agrees to call 911, bring patient to emergency room, or bring patient to [facility] if he becomes a threat to himself or others.  Stay Summary:  Presented 2/21 to [facility] voluntarily with his mother. He was admitted overnight due to paranoia, depression, insomnia, and AV hallucinations. He presented disorganized, and was initially trying to return to the car he arrived in to retreive a gun to protect the family. He was already being treated for depression outpatient with sertraline 100mg  daily.  This medication was continued, and olanzapine 5mg  qHS was initiated for management of psychotic symptoms. He was reassessed today and noted to have marked improvement of symptoms and no safety concerns.  He is discharged home with parents with recommended follow up in 1 week at [facility] during walk-in hours for open access.  Total Time spent with patient: 30 minutes  Past Psychiatric History: History of depression.  He is being treated with Sertraline 100mg  which he states is being managed by his primary care physician.  Past Medical History:  Past Medical History:  Diagnosis Date  . Anxiety   . Depression     Past Surgical History:  Procedure Laterality Date  . KNEE ARTHROSCOPY W/ MENISCAL REPAIR Right    Family History:  Family History  Problem Relation Age of Onset  . Healthy Mother   . COPD Father        lung transplant  . Healthy Sister   . Healthy Brother   . Diabetes Paternal Grandmother  Family Psychiatric History: Per mother, significant mental illness on father's side of the family.  Social History:  Social History   Substance and Sexual Activity  Alcohol Use Yes  . Alcohol/week: 0.0 standard drinks   Comment: drinks 2 bottles (12 oz) occasionally- socially     Social History   Substance and Sexual Activity  Drug Use No    Social History   Socioeconomic History  .  Marital status: Single    Spouse name: Not on file  . Number of children: 0  . Years of education: Not on file  . Highest education level: Bachelor's degree (e.g., BA, AB, BS)  Occupational History  . Occupation: Training and development officercredit analyst    Comment: [employer]  Tobacco Use  . Smoking status: Never Smoker  . Smokeless tobacco: Never Used  Vaping Use  . Vaping Use: Never used  Substance and Sexual Activity  . Alcohol use: Yes    Alcohol/week: 0.0 standard drinks    Comment: drinks 2 bottles (12 oz) occasionally- socially  . Drug use: No  . Sexual activity: Yes    Partners: Female    Birth control/protection: Condom  Other Topics Concern  . Not on file  Social History Narrative  . Not on file   Social Determinants of Health   Financial Resource Strain: Not on file  Food Insecurity: Not on file  Transportation Needs: Not on file  Physical Activity: Not on file  Stress: Not on file  Social Connections: Not on file   SDOH:  SDOH Screenings   Alcohol Screen: Not on file  Depression (PHQ2-9): Low Risk   . PHQ-2 Score: 0  Financial Resource Strain: Not on file  Food Insecurity: Not on file  Housing: Not on file  Physical Activity: Not on file  Social Connections: Not on file  Stress: Not on file  Tobacco Use: Low Risk   . Smoking Tobacco Use: Never Smoker  . Smokeless Tobacco Use: Never Used  Transportation Needs: Not on file    Has this patient used any form of tobacco in the last 30 days? No  Current Medications:  Current Facility-Administered Medications  Medication Dose Route Frequency Provider Last Rate Last Admin  . acetaminophen (TYLENOL) tablet 650 mg  650 mg Oral Q6H PRN Yazmine Sorey, Gerlene Burdockravis B, FNP      . alum & mag hydroxide-simeth (MAALOX/MYLANTA) 200-200-20 MG/5ML suspension 30 mL  30 mL Oral Q4H PRN Megean Fabio, Gerlene Burdockravis B, FNP      . hydrOXYzine (ATARAX/VISTARIL) tablet 25 mg  25 mg Oral TID PRN Berdina Cheever, Gerlene Burdockravis B, FNP   25 mg at 03/07/20 2100  . magnesium hydroxide (MILK OF  MAGNESIA) suspension 30 mL  30 mL Oral Daily PRN Deano Tomaszewski, Gerlene Burdockravis B, FNP      . OLANZapine (ZYPREXA) tablet 5 mg  5 mg Oral QHS Robbin Loughmiller, Gerlene Burdockravis B, FNP   5 mg at 03/07/20 2100  . sertraline (ZOLOFT) tablet 100 mg  100 mg Oral Daily Sonnie Pawloski, Gerlene Burdockravis B, FNP   100 mg at 03/08/20 0945  . traZODone (DESYREL) tablet 50 mg  50 mg Oral QHS PRN Khianna Blazina, Gerlene Burdockravis B, FNP   50 mg at 03/07/20 2100   Current Outpatient Medications  Medication Sig Dispense Refill  . OLANZapine (ZYPREXA) 5 MG tablet Take 1 tablet (5 mg total) by mouth at bedtime. 30 tablet 0  . sertraline (ZOLOFT) 100 MG tablet Take one tablet po daily 90 tablet 1  . traZODone (DESYREL) 50 MG tablet Take 1 tablet (50  mg total) by mouth at bedtime as needed for sleep. 30 tablet 0    PTA Medications: (Not in a hospital admission)   Musculoskeletal  Strength & Muscle Tone: within normal limits Gait & Station: normal Patient leans: N/A  Psychiatric Specialty Exam  Presentation  General Appearance: Appropriate for Environment; Casual; Fairly Groomed  Eye Contact:Minimal  Speech:Normal Rate  Speech Volume:Decreased  Handedness:Right   Mood and Affect  Mood:Euthymic  Affect:Non-Congruent; Depressed   Thought Process  Thought Processes:Coherent  Descriptions of Associations:Intact  Orientation:Full (Time, Place and Person)  Thought Content:Logical  Hallucinations:Hallucinations: None Description of Auditory Hallucinations: voices telling that they are going to hurt his family Description of Visual Hallucinations: Sees people outside that are not there, feels they are coming after the family  Ideas of Reference:None  Suicidal Thoughts:Suicidal Thoughts: No  Homicidal Thoughts:Homicidal Thoughts: No   Sensorium  Memory:Immediate Fair; Remote Fair; Recent Fair  Judgment:Fair  Insight:Present   Executive Functions  Concentration:Good  Attention Span:Good  Recall:Good  Fund of  Knowledge:Good  Language:Good   Psychomotor Activity  Psychomotor Activity:Psychomotor Activity: Normal   Assets  Assets:Communication Skills; Desire for Improvement; Housing; Physical Health; Social Support; Transportation   Sleep  Sleep:Sleep: Good Number of Hours of Sleep: 10   Physical Exam  Physical Exam Vitals and nursing note reviewed.  Constitutional:      Appearance: He is well-developed.  HENT:     Head: Normocephalic.  Eyes:     Pupils: Pupils are equal, round, and reactive to light.  Cardiovascular:     Rate and Rhythm: Normal rate.  Pulmonary:     Effort: Pulmonary effort is normal.  Musculoskeletal:        General: Normal range of motion.  Neurological:     Mental Status: He is alert and oriented to person, place, and time.  Psychiatric:        Attention and Perception: Attention normal.        Mood and Affect: Mood normal.        Speech: Speech normal.        Behavior: Behavior is cooperative.        Thought Content: Thought content is not paranoid or delusional. Thought content does not include homicidal or suicidal ideation.        Cognition and Memory: Cognition normal.        Judgment: Judgment normal.    Review of Systems  Constitutional: Negative.   HENT: Negative.   Eyes: Negative.   Respiratory: Negative.   Cardiovascular: Negative.   Gastrointestinal: Negative.   Genitourinary: Negative.   Musculoskeletal: Negative.   Skin: Negative.   Neurological: Negative.   Endo/Heme/Allergies: Negative.   Psychiatric/Behavioral: Positive for depression and substance abuse. Negative for hallucinations, memory loss and suicidal ideas. The patient is not nervous/anxious and does not have insomnia.    Blood pressure (!) 147/91, pulse (!) 50, temperature 97.7 F (36.5 C), temperature source Oral, resp. rate 16, height 6\' 2"  (1.88 m), weight 222 lb (100.7 kg), SpO2 100 %. Body mass index is 28.5 kg/m.  Demographic Factors:  Male  Loss  Factors: NA  Historical Factors: Family history of mental illness or substance abuse  Risk Reduction Factors:   Living with another person, especially a relative, Positive social support and Positive coping skills or problem solving skills  Continued Clinical Symptoms:  Depression:   Anhedonia Insomnia Severe  Cognitive Features That Contribute To Risk:  None    Suicide Risk:  Mild:  Suicidal ideation of  limited frequency, intensity, duration, and specificity.  There are no identifiable plans, no associated intent, mild dysphoria and related symptoms, good self-control (both objective and subjective assessment), few other risk factors, and identifiable protective factors, including available and accessible social support.   Plan Of Care/Follow-up recommendations:  Activity:  As tolerated Diet:  General Other:  Follow up with [facility] during walk-in hours in 1 week  Disposition: Discharge home to mother  Maryfrances Bunnell, FNP 03/08/2020, 10:54 AM

## 2020-03-08 NOTE — ED Notes (Signed)
Pt stated, "I'm not hearing the voices as much this morning. I'm feeling better". Pt accepted PO morning meds w/o difficulty. Safety maintained and will continue to monitor.

## 2020-03-15 ENCOUNTER — Telehealth (HOSPITAL_COMMUNITY): Payer: Self-pay | Admitting: Family Medicine

## 2020-03-15 NOTE — Telephone Encounter (Signed)
Care Management - Follow Up [facility] Discharges   Writer attempted to make contact with patient today and was unsuccessful.  Writer was able to leave a HIPPA compliant voice message and will await callback.
